# Patient Record
Sex: Female | Born: 1970 | Race: Black or African American | Hispanic: No | Marital: Married | Smoking: Never smoker
Health system: Southern US, Community
[De-identification: ages and names within clinical notes are randomized; demographics above are authoritative.]

## PROBLEM LIST (undated history)

## (undated) DIAGNOSIS — E079 Disorder of thyroid, unspecified: Secondary | ICD-10-CM

## (undated) DIAGNOSIS — K602 Anal fissure, unspecified: Secondary | ICD-10-CM

## (undated) DIAGNOSIS — I10 Essential (primary) hypertension: Secondary | ICD-10-CM

## (undated) DIAGNOSIS — K219 Gastro-esophageal reflux disease without esophagitis: Secondary | ICD-10-CM

## (undated) DIAGNOSIS — D649 Anemia, unspecified: Secondary | ICD-10-CM

## (undated) DIAGNOSIS — Z8709 Personal history of other diseases of the respiratory system: Secondary | ICD-10-CM

## (undated) DIAGNOSIS — E785 Hyperlipidemia, unspecified: Secondary | ICD-10-CM

## (undated) HISTORY — DX: Essential (primary) hypertension: I10

## (undated) HISTORY — DX: Anal fissure, unspecified: K60.2

## (undated) HISTORY — DX: Hyperlipidemia, unspecified: E78.5

## (undated) HISTORY — PX: APPENDECTOMY: SHX54

## (undated) HISTORY — DX: Personal history of other diseases of the respiratory system: Z87.09

## (undated) HISTORY — DX: Gastro-esophageal reflux disease without esophagitis: K21.9

## (undated) HISTORY — PX: BREAST SURGERY: SHX581

## (undated) HISTORY — PX: WISDOM TOOTH EXTRACTION: SHX21

## (undated) HISTORY — DX: Anemia, unspecified: D64.9

## (undated) HISTORY — DX: Disorder of thyroid, unspecified: E07.9

---

## 2000-10-01 ENCOUNTER — Emergency Department (HOSPITAL_COMMUNITY): Admission: EM | Admit: 2000-10-01 | Discharge: 2000-10-01 | Payer: Self-pay | Admitting: Emergency Medicine

## 2002-12-02 ENCOUNTER — Encounter: Admission: RE | Admit: 2002-12-02 | Discharge: 2002-12-02 | Payer: Self-pay | Admitting: *Deleted

## 2002-12-02 ENCOUNTER — Encounter: Payer: Self-pay | Admitting: Allergy and Immunology

## 2003-02-07 ENCOUNTER — Other Ambulatory Visit: Admission: RE | Admit: 2003-02-07 | Discharge: 2003-02-07 | Payer: Self-pay | Admitting: Obstetrics and Gynecology

## 2004-03-01 ENCOUNTER — Other Ambulatory Visit: Admission: RE | Admit: 2004-03-01 | Discharge: 2004-03-01 | Payer: Self-pay | Admitting: Obstetrics and Gynecology

## 2004-03-16 ENCOUNTER — Ambulatory Visit (HOSPITAL_COMMUNITY): Admission: RE | Admit: 2004-03-16 | Discharge: 2004-03-16 | Payer: Self-pay | Admitting: Obstetrics and Gynecology

## 2004-06-04 ENCOUNTER — Ambulatory Visit (HOSPITAL_COMMUNITY): Admission: RE | Admit: 2004-06-04 | Discharge: 2004-06-04 | Payer: Self-pay | Admitting: Obstetrics and Gynecology

## 2004-10-07 ENCOUNTER — Inpatient Hospital Stay (HOSPITAL_COMMUNITY): Admission: AD | Admit: 2004-10-07 | Discharge: 2004-10-07 | Payer: Self-pay | Admitting: Obstetrics and Gynecology

## 2004-10-08 ENCOUNTER — Ambulatory Visit: Payer: Self-pay | Admitting: Family Medicine

## 2004-10-11 ENCOUNTER — Ambulatory Visit (HOSPITAL_COMMUNITY): Admission: RE | Admit: 2004-10-11 | Discharge: 2004-10-11 | Payer: Self-pay | Admitting: Obstetrics and Gynecology

## 2004-10-11 ENCOUNTER — Ambulatory Visit: Payer: Self-pay | Admitting: *Deleted

## 2004-10-19 ENCOUNTER — Inpatient Hospital Stay (HOSPITAL_COMMUNITY): Admission: AD | Admit: 2004-10-19 | Discharge: 2004-10-24 | Payer: Self-pay | Admitting: Obstetrics and Gynecology

## 2005-07-18 ENCOUNTER — Encounter: Admission: RE | Admit: 2005-07-18 | Discharge: 2005-07-18 | Payer: Self-pay | Admitting: Internal Medicine

## 2005-07-25 ENCOUNTER — Other Ambulatory Visit: Admission: RE | Admit: 2005-07-25 | Discharge: 2005-07-25 | Payer: Self-pay | Admitting: Obstetrics and Gynecology

## 2009-06-23 ENCOUNTER — Emergency Department (HOSPITAL_BASED_OUTPATIENT_CLINIC_OR_DEPARTMENT_OTHER): Admission: EM | Admit: 2009-06-23 | Discharge: 2009-06-23 | Payer: Self-pay | Admitting: Emergency Medicine

## 2010-02-05 ENCOUNTER — Encounter: Admission: RE | Admit: 2010-02-05 | Discharge: 2010-02-05 | Payer: Self-pay | Admitting: Obstetrics and Gynecology

## 2010-11-16 NOTE — Op Note (Signed)
NAMEMIRIAM, Paula Huffman NO.:  [id number]   MEDICAL RECORD NO.:  [id number]          PATIENT TYPE:  INP   LOCATION:  9151                          FACILITY:  [facility]   PHYSICIAN:  Crist Fat. Rivard, M.D. DATE OF BIRTH:  Apr 12, 1971   DATE OF PROCEDURE:  DATE OF DISCHARGE:                                 OPERATIVE REPORT   PREOPERATIVE DIAGNOSES:  Intra-uterine pregnancy at 39 weeks and 3 days, pre-  eclampsia, failure to progress.   POSTOPERATIVE DIAGNOSES:  Intra-uterine pregnancy at 39 weeks and 3 days,  pre-eclampsia, failure to progress.   ANESTHESIA:  Epidural, Dr. Arby Barrette.   PROCEDURE:  Primary low transverse cesarean section.   SURGEON:  Dr. Estanislado Pandy.   ASSISTANT:  Concha Pyo. Duplantis, C.N.M.   ESTIMATED BLOOD LOSS:  800 cc.   PROCEDURE:  After being informed of the planned procedure with possible  complications including bleeding, infection, injury to bowels, bladder, or  ureters, informed consent is obtained.  The patient is taken to OR #1 and  pre-existing epidural is reinforced.  She is placed in the dorsal decubitus  position, pelvis tilted to the left, prepped and draped in a sterile fashion  and a Foley catheter was already in place.   After accessing adequate level of anesthesia, the suprapubic area is  infiltrated with Marcaine 0.25, 20 cc.  We performed a Pfannenstiel incision  which is brought down to the fascia.  The fascia is incised in the low  transverse fashion.  The linea alba is dissected.  The peritoneum is entered  in the midline fashion.  The visceroperitoneum is entered in the low  transverse fashion, allowing [place] to safely retract bladder by developing a  bladder flap.  Myometrium is entered in a low transverse fashion, first with  knife and then extended bluntly.  Amniotic fluid is clear.  We assist the  birth of a female infant in left OP.  Mouth and nose are suctioned.  Nuchal  cord is reduced.  Body is delivered.  Cord is clamped  with two Kelly clamps  in sections and the baby is given to the pediatrician present in the room.  Please note that the baby's head was completely outside the bony pelvis with  the ballotable presentation.  The placenta is allowed to deliver  spontaneously.  It is complete.  Cord has three vessels and the cord does  have a velamentous insertion.   Uterine revision is negative.   We proceed with closure of the myometrium in two layers, first with a  running locked suture of 0 Vicryl, then with a Lembert suture of 0 Vicryl,  imbricating the first layer.  Hemostasis is accessed and adequate.  Both  tubes and ovaries are assessed and normal.  Both pericolic gutters are  cleansed and the pelvis is profusely irrigated with warm saline.  Hemostasis  is rechecked and adequate.   On the fascia, hemostasis is completed with cautery and the fascia is closed  with two running sutures of 1 Vicryl meeting midline.  The wound is  irrigated with warm saline and hemostasis is achieved with cautery.  The  skin is closed with staples.   Instruments and sponge count is complete x2.  Estimated blood loss is 800  cc.  Urine output during the procedure is 212 cc.  IV input 900 cc.  The  little girl named Pablo Ledger was born at 4:58 p.m., received an Apgar of  9 at one minute and 9 at five minutes and weighed 7 pounds.  The procedure  was well tolerated by the patient who is taken to the recovery room in a  well and stable condition.      SAR/MEDQ  D:  10/20/2004  T:  10/21/2004  Job:  [id number]

## 2010-11-16 NOTE — H&P (Signed)
Paula Huffman, Paula Huffman NO.:  [id number]   MEDICAL RECORD NO.:  [id number]          PATIENT TYPE:  INP   LOCATION:  9174                          [facility]ACILITY:  [facility]   PHYSICIAN:  Crist Fat. Rivard, M.D. DATE OF BIRTH:  [birth year]   DATE OF ADMISSION:  10/19/2004  DATE OF DISCHARGE:                                HISTORY & PHYSICAL   HISTORY OF PRESENT ILLNESS:  This is a 40 year old gravida 1 para 0 at 69  and two-sevenths weeks who presents from the office with preeclampsia. Blood  pressures have been elevated since early April with onset of proteinuria  this week. She does have a history of chronic hypertension per chart review,  though the patient denies this history. She had a 24-hour urine protein on  October 05, 2004 which was 275 and this week it was greater than 300. She has  been on Aldomet and bedrest for several weeks. Pregnancy has been followed  by the nurse midwife service with M.D. collaboration and remarkable for:  1.  First trimester bleeding.  2.  Graves disease/hypothyroidism.  3.  AMA.  4.  PENICILLIN allergy.  5.  Chronic hypertension.  6.  Questionable borderline pelvic.   OBSTETRICAL HISTORY:  The patient is a primigravida.   ALLERGIES:  PENICILLIN causes a rash.   MEDICATIONS:  Prenatal vitamins and Aldomet.   MEDICAL HISTORY:  Remarkable for childhood varicella, chronic hypertension,  history of mild asthma, history of Graves disease for which she takes  Synthroid for hypothyroidism. She has a history of acid reflux, relieved by  Prevacid. The patient has a history of high cholesterol and had been on  Lipitor for that.   SURGICAL HISTORY:  Remarkable for an appendectomy in 1995 and a breast lump  in 1998.   FAMILY HISTORY:  Remarkable for a grandfather with heart disease. Mother and  sisters with hypertension. Grandparents and father with hypertension.  Grandmother with varicose veins and another grandmother with a blood clot.  Mother  with borderline diabetes and grandmother with diabetes. Mother with  hyperthyroidism and grandmother with hyperthyroidism. She has a grandmother  with Alzheimer's.   GENETIC HISTORY:  Remarkable for the patient's age of 57 and a paternal  grandmother with sickle cell trait.   SOCIAL HISTORY:  The patient is single. The father of the baby, Alva Garnet, is not currently with the patient. The patient works as a Publishing rights manager. She denies any alcohol, tobacco, or drug use.   PRENATAL LABORATORY DATA:  Hemoglobin 12.1, platelets 300. Blood type O  positive, antibody screen negative. Sickle cell negative. RPR nonreactive.  Rubella immune. Hepatitis negative. HIV negative. Cystic fibrosis declined.  Glucola was normal. Group B strep was negative. Gonorrhea and chlamydia were  negative at term.   HISTORY OF CURRENT PREGNANCY:  The patient entered care at [redacted] weeks  gestation. She had an ultrasound at that time to confirm dates. She had some  first trimester bleeding which resolved. Her Synthroid was increased to 75  mcg at 16 weeks. She had an ultrasound at 19 weeks which was normal. Her  thyroid  function was followed by her endocrinologist and they increased her  medication in the second trimester to meet her needs. She had a normal  Glucola at 27 weeks. She had one elevation in her blood pressure at 29 weeks  with no symptoms of preeclampsia at that time. She continued to have labile  blood pressures after that. She was seen by Dr. Pennie Rushing and Dr. Estanislado Pandy  periodically. Her 24-hour urine at 30 weeks was 100 mg per 24 hours and so  she began to work half days and did some bedrest. Blood pressure remained  120/80 to 130/80. She had a negative group B strep and cultures at 36 weeks.  A 24-hour urine was repeated on October 05, 2004 and showed 275 mg per 24  hours. Blood pressure ran 150/100 at that visit. She was placed on bedrest  and Aldomet for treatment of her blood pressures and monitored  closely. She  presented this week with increased blood pressure and increased proteinuria  and is therefore being admitted for induction of labor.   OBJECTIVE DATA:  VITAL SIGNS:  Stable, blood pressure was 130/100 in the  office.  HEENT:  Within normal limits. Thyroid normal, not enlarged.  CHEST:  Clear to auscultation.  HEART:  Regular rate and rhythm.  ABDOMEN:  Gravid at 39 cm, vertex to Leopold's. Fetal heart rate 150 in the  office. She has not been placed on fetal monitoring as of yet. There are no  uterine contractions other than [employer].  PELVIC:  Cervix is to be examined by Dr. Estanislado Pandy.  EXTREMITIES:  Show 1-2+ pedal edema with trace edema in the hands. DTRs are  2+.   ASSESSMENT:  1.  Intrauterine pregnancy at 46 and two-sevenths weeks.  2.  Preeclampsia.   PLAN:  1.  Admit to birthing suites were Dr. Estanislado Pandy.  2.  Induction of labor.  3.  Magnesium sulfate.  4.  M.D. to follow.      MLW/MEDQ  D:  10/19/2004  T:  10/19/2004  Job:  [id number]

## 2010-11-16 NOTE — Discharge Summary (Signed)
Paula Huffman, BARTOLOTTA NO.:  [id number]   MEDICAL RECORD NO.:  [id number]          PATIENT TYPE:  INP   LOCATION:  9113                          [facility]ILITY:  [facility]   PHYSICIAN:  Hal Morales, M.D.DATE OF BIRTH:  1971/02/26   DATE OF ADMISSION:  10/19/2004  DATE OF DISCHARGE:                                 DISCHARGE SUMMARY   ADMITTING DIAGNOSES:  1.  Intrauterine pregnancy at 3 and three-sevenths weeks.  2.  Preeclampsia.   PREOPERATIVE DIAGNOSES:  1.  Intrauterine pregnancy at 57 and three-sevenths weeks.  2.  Preeclampsia.  3.  Failure to progress.   PROCEDURE:  Primary low transverse cesarean section.   DISCHARGE DIAGNOSES:  1.  Intrauterine pregnancy at 30 and three-sevenths weeks.  2.  Preeclampsia.  3.  Failure to progress.  4.  Primary low transverse cesarean section.  5.  Low-grade maternal temperature postoperatively.   Ms. Paula Huffman is a 41 year old gravida 1 para 0 who presented from the office of  [facility] with elevated blood pressures and proteinuria for induction of labor  secondary to preeclampsia. She progressed to 4 cm dilated and despite  adequate contractions, did not progress further. She therefore consented to  cesarean delivery. This was accomplished on October 20, 2004 by Dr. Dois Davenport  Rivard. The patient underwent primary low transverse cesarean section with  the birth of a 7-pound female infant named Pablo Ledger with Apgar scores  of 9 at one minute, 9 at five minutes. The patient was maintained on  magnesium sulfate x24 hours after delivery. She diuresed well; however, she  developed a low-grade temperature to 100.5 on postoperative day #2 and her  blood pressures continued to be mildly elevated in the 130s to 140s over 80s  to 90s. She was begun on hydrochlorothiazide and arrangements were made for  postpartum visits of the [employer] nurse. Twenty-four hours after starting  hydrochlorothiazide her blood pressures have continued to be  maintained in  the 130s to 140s over 80s to 90s and are stable. Her temperature has been  afebrile for greater than 24 hours and she is in satisfactory condition for  discharge. Her incision is clean and intact with staples which will be  removed prior to discharge. Her extremities show 1+ edema, DTRs are 1+ with  no clonus. She is aware of PIH symptoms and will call for any of these  symptoms or any problems or concerns.   DISCHARGE INSTRUCTIONS:  Per [facility] handout.   DISCHARGE MEDICATIONS:  1.  Motrin 600 mg p.o. q.6h. p.r.n. pain.  2.  Tylox one to two p.o. q.3-4h. pain.  3.  Prenatal vitamins.  4.  She will continue hydrochlorothiazide 25 mg p.o. daily and be seen by      the [facility] nurse in 48 hours, and at [facility] in 10 days. She will plan      to stop hydrochlorothiazide at that time if her blood pressures have      remained normal.      SDM/MEDQ  D:  10/24/2004  T:  10/24/2004  Job:  [id number]

## 2011-02-11 ENCOUNTER — Other Ambulatory Visit (HOSPITAL_BASED_OUTPATIENT_CLINIC_OR_DEPARTMENT_OTHER): Payer: Self-pay | Admitting: Obstetrics and Gynecology

## 2011-02-11 DIAGNOSIS — Z1231 Encounter for screening mammogram for malignant neoplasm of breast: Secondary | ICD-10-CM

## 2011-02-21 ENCOUNTER — Ambulatory Visit (HOSPITAL_BASED_OUTPATIENT_CLINIC_OR_DEPARTMENT_OTHER)
Admission: RE | Admit: 2011-02-21 | Discharge: 2011-02-21 | Disposition: A | Discharge status: Home / Self Care | Payer: BC Managed Care – PPO | Source: Ambulatory Visit | Attending: Obstetrics and Gynecology | Admitting: Obstetrics and Gynecology

## 2011-02-21 DIAGNOSIS — Z1231 Encounter for screening mammogram for malignant neoplasm of breast: Secondary | ICD-10-CM | POA: Insufficient documentation

## 2012-02-25 ENCOUNTER — Other Ambulatory Visit: Payer: Self-pay | Admitting: Obstetrics and Gynecology

## 2012-02-25 DIAGNOSIS — Z139 Encounter for screening, unspecified: Secondary | ICD-10-CM

## 2012-02-26 ENCOUNTER — Ambulatory Visit (HOSPITAL_BASED_OUTPATIENT_CLINIC_OR_DEPARTMENT_OTHER): Payer: BC Managed Care – PPO

## 2012-02-26 ENCOUNTER — Ambulatory Visit (HOSPITAL_BASED_OUTPATIENT_CLINIC_OR_DEPARTMENT_OTHER)
Admission: RE | Admit: 2012-02-26 | Discharge: 2012-02-26 | Disposition: A | Discharge status: Home / Self Care | Payer: BC Managed Care – PPO | Source: Ambulatory Visit | Attending: Obstetrics and Gynecology | Admitting: Obstetrics and Gynecology

## 2012-02-26 DIAGNOSIS — Z139 Encounter for screening, unspecified: Secondary | ICD-10-CM

## 2012-02-26 DIAGNOSIS — R922 Inconclusive mammogram: Secondary | ICD-10-CM | POA: Insufficient documentation

## 2012-03-03 ENCOUNTER — Other Ambulatory Visit: Payer: Self-pay | Admitting: Obstetrics and Gynecology

## 2012-03-03 DIAGNOSIS — R928 Other abnormal and inconclusive findings on diagnostic imaging of breast: Secondary | ICD-10-CM

## 2012-03-04 ENCOUNTER — Ambulatory Visit
Admission: RE | Admit: 2012-03-04 | Discharge: 2012-03-04 | Disposition: A | Discharge status: Home / Self Care | Payer: BC Managed Care – PPO | Source: Ambulatory Visit | Attending: Obstetrics and Gynecology | Admitting: Obstetrics and Gynecology

## 2012-03-04 DIAGNOSIS — R928 Other abnormal and inconclusive findings on diagnostic imaging of breast: Secondary | ICD-10-CM

## 2012-04-08 ENCOUNTER — Other Ambulatory Visit: Payer: Self-pay

## 2012-04-28 ENCOUNTER — Encounter: Payer: Self-pay | Admitting: Obstetrics and Gynecology

## 2012-04-28 ENCOUNTER — Ambulatory Visit (INDEPENDENT_AMBULATORY_CARE_PROVIDER_SITE_OTHER): Payer: BC Managed Care – PPO | Admitting: Obstetrics and Gynecology

## 2012-04-28 VITALS — BP 132/80 | Ht 63.5 in | Wt 183.0 lb

## 2012-04-28 DIAGNOSIS — Z124 Encounter for screening for malignant neoplasm of cervix: Secondary | ICD-10-CM

## 2012-04-28 DIAGNOSIS — Z01419 Encounter for gynecological examination (general) (routine) without abnormal findings: Secondary | ICD-10-CM

## 2012-04-28 MED ORDER — NORETHINDRONE 0.35 MG PO TABS: 1.0000 | ORAL_TABLET | Freq: Every day | ORAL | Status: AC

## 2012-04-28 NOTE — Progress Notes (Signed)
Regular Periods: yes Mammogram: yes  Monthly Breast Ex.: yes Exercise: no  Tetanus < 10 years: yes Seatbelts: yes  NI. Bladder Functn.: yes Abuse at home: no  Daily BM's: yes Stressful Work: yes  Healthy Diet: yes Sigmoid-Colonoscopy: NO  Calcium: no Medical problems this year: NONE   LAST PAP:2012  Contraception: CAMILA  Mammogram:  8/13  PCP: DR. MCNEILL  PMH: NO CHANGE  FMH: NO CHANGE  Last Bone Scan: NO  PT IS MARRIED

## 2012-04-28 NOTE — Progress Notes (Signed)
Subjective:    Paula Huffman is a 41 y.o. female, G1P0, who presents for an annual exam. The patient reports no complaints.  Menstrual cycle:   LMP: Patient's last menstrual period was 04/14/2012.             Review of Systems Pertinent items are noted in HPI. Denies pelvic pain, urinary tract symptoms, vaginitis symptoms, irregular bleeding, menopausal symptoms, change in bowel habits or rectal bleeding   Objective:    BP 132/80  Ht 5' 3.5" (1.613 m)  Wt 183 lb (83.008 kg)  BMI 31.91 kg/m2  LMP 04/14/2012     Wt Readings from Last 1 Encounters:  04/28/12 183 lb (83.008 kg)   Body mass index is 31.91 kg/(m^2). General Appearance: Alert, no acute distress HEENT: Grossly normal Neck / Thyroid: Supple, no thyromegaly or cervical adenopathy Lungs: Clear to auscultation bilaterally Back: No CVA tenderness Breast Exam: No masses or nodes.No dimpling, nipple retraction or discharge. Cardiovascular: Regular rate and rhythm.  Gastrointestinal: Soft, non-tender, no masses or organomegaly Pelvic Exam: EGBUS-wnl, vagina-normal rugae, cervix- without lesions or tenderness, uterus appears normal size shape and consistency, adnexae-no masses or tenderness Rectovaginal: no masses and normal sphincter tone Lymphatic Exam: Non-palpable nodes in neck, clavicular,  axillary, or inguinal regions  Skin: no rashes or abnormalities Extremities: no clubbing cyanosis or edema  Neurologic: grossly normal Psychiatric: Alert and oriented    Assessment:   Routine GYN Exam   Plan:    PAP sent  Continue Camila  #1  1 po qd 11 refills  RTO 1 year or prn  Adeliz Tonkinson,ELMIRAPA-C

## 2012-04-29 LAB — PAP IG W/ RFLX HPV ASCU

## 2013-02-10 ENCOUNTER — Other Ambulatory Visit: Payer: Self-pay | Admitting: Obstetrics and Gynecology

## 2013-02-10 DIAGNOSIS — Z1231 Encounter for screening mammogram for malignant neoplasm of breast: Secondary | ICD-10-CM

## 2013-03-05 ENCOUNTER — Ambulatory Visit (HOSPITAL_BASED_OUTPATIENT_CLINIC_OR_DEPARTMENT_OTHER)
Admission: RE | Admit: 2013-03-05 | Discharge: 2013-03-05 | Disposition: A | Discharge status: Home / Self Care | Payer: BC Managed Care – PPO | Source: Ambulatory Visit | Attending: Obstetrics and Gynecology | Admitting: Obstetrics and Gynecology

## 2013-03-05 DIAGNOSIS — Z1231 Encounter for screening mammogram for malignant neoplasm of breast: Secondary | ICD-10-CM | POA: Insufficient documentation

## 2014-02-11 ENCOUNTER — Other Ambulatory Visit (HOSPITAL_BASED_OUTPATIENT_CLINIC_OR_DEPARTMENT_OTHER): Payer: Self-pay | Admitting: Obstetrics and Gynecology

## 2014-02-11 DIAGNOSIS — Z1231 Encounter for screening mammogram for malignant neoplasm of breast: Secondary | ICD-10-CM

## 2014-03-11 ENCOUNTER — Ambulatory Visit (HOSPITAL_BASED_OUTPATIENT_CLINIC_OR_DEPARTMENT_OTHER): Payer: BC Managed Care – PPO

## 2014-03-18 ENCOUNTER — Ambulatory Visit (HOSPITAL_BASED_OUTPATIENT_CLINIC_OR_DEPARTMENT_OTHER)
Admission: RE | Admit: 2014-03-18 | Discharge: 2014-03-18 | Disposition: A | Discharge status: Home / Self Care | Payer: BC Managed Care – PPO | Source: Ambulatory Visit | Attending: Obstetrics and Gynecology | Admitting: Obstetrics and Gynecology

## 2014-03-18 DIAGNOSIS — Z1231 Encounter for screening mammogram for malignant neoplasm of breast: Secondary | ICD-10-CM | POA: Diagnosis present

## 2014-05-02 ENCOUNTER — Encounter: Payer: Self-pay | Admitting: Obstetrics and Gynecology

## 2014-11-03 ENCOUNTER — Other Ambulatory Visit: Payer: Self-pay | Admitting: Physician Assistant

## 2014-11-03 ENCOUNTER — Ambulatory Visit
Admission: RE | Admit: 2014-11-03 | Discharge: 2014-11-03 | Disposition: A | Discharge status: Home / Self Care | Payer: BC Managed Care – PPO | Source: Ambulatory Visit | Attending: Physician Assistant | Admitting: Physician Assistant

## 2014-11-03 DIAGNOSIS — R0781 Pleurodynia: Secondary | ICD-10-CM

## 2017-11-06 ENCOUNTER — Encounter: Payer: Self-pay | Admitting: Skilled Nursing Facility1

## 2017-11-06 ENCOUNTER — Encounter: Payer: BC Managed Care – PPO | Attending: Family Medicine | Admitting: Skilled Nursing Facility1

## 2017-11-06 DIAGNOSIS — E119 Type 2 diabetes mellitus without complications: Secondary | ICD-10-CM | POA: Diagnosis present

## 2017-11-06 DIAGNOSIS — Z713 Dietary counseling and surveillance: Secondary | ICD-10-CM | POA: Insufficient documentation

## 2017-11-06 NOTE — Progress Notes (Signed)
Diabetes Self-Management Education  Visit Type: First/Initial  11/06/2017  Paula Huffman, identified by name and date of birth, is a 47 y.o. female with a diagnosis of Diabetes: Type 2.   ASSESSMENT  There were no vitals taken for this visit. There is no height or weight on file to calculate BMI.  Pt was very knowledgeable and receptive.  Diabetes Self-Management Education - 11/06/17 1532      Visit Information   Visit Type  First/Initial      Initial Visit   Diabetes Type  Type 2    Are you currently following a meal plan?  No    Are you taking your medications as prescribed?  Yes      Psychosocial Assessment   Patient Belief/Attitude about Diabetes  Motivated to manage diabetes      Pre-Education Assessment   Patient understands the diabetes disease and treatment process.  Needs Instruction    Patient understands incorporating nutritional management into lifestyle.  Needs Instruction    Patient undertands incorporating physical activity into lifestyle.  Needs Instruction    Patient understands using medications safely.  Needs Instruction    Patient understands monitoring blood glucose, interpreting and using results  Needs Instruction    Patient understands prevention, detection, and treatment of acute complications.  Needs Instruction    Patient understands prevention, detection, and treatment of chronic complications.  Needs Instruction    Patient understands how to develop strategies to address psychosocial issues.  Needs Instruction    Patient understands how to develop strategies to promote health/change behavior.  Needs Instruction      Complications   Last HgB A1C per patient/outside source  6.7 %    How often do you check your blood sugar?  0 times/day (not testing)    Have you had a dilated eye exam in the past 12 months?  Yes    Have you had a dental exam in the past 12 months?  Yes    Are you checking your feet?  No      Dietary Intake   Breakfast   [place] bread oatmeal    Snack (morning)  granola bar    Lunch  sandwich or sub or salad     Snack (afternoon)  nuts or fruit or crackers    Dinner  chicken grilled or breaded with vegetable and starch or pizza    Beverage(s)  water, juice with water, unsweet herbal tea      Exercise   Exercise Type  ADL's      Patient Education   Previous Diabetes Education  No    Disease state   Definition of diabetes, type 1 and 2, and the diagnosis of diabetes;Factors that contribute to the development of diabetes    Nutrition management   Role of diet in the treatment of diabetes and the relationship between the three main macronutrients and blood glucose level;Food label reading, portion sizes and measuring food.;Meal options for control of blood glucose level and chronic complications.;Information on hints to eating out and maintain blood glucose control.    Physical activity and exercise   Role of exercise on diabetes management, blood pressure control and cardiac health.;Identified with patient nutritional and/or medication changes necessary with exercise.    Monitoring  Purpose and frequency of SMBG.;Yearly dilated eye exam;Daily foot exams    Acute complications  Taught treatment of hypoglycemia - the 15 rule.;Discussed and identified patients' treatment of hyperglycemia.    Chronic complications  Dental care;Retinopathy  and reason for yearly dilated eye exams;Assessed and discussed foot care and prevention of foot problems    Psychosocial adjustment  Role of stress on diabetes      Individualized Goals (developed by patient)   Nutrition  General guidelines for healthy choices and portions discussed;Adjust meds/carbs with exercise as discussed    Physical Activity  Exercise 5-7 days per week;30 minutes per day    Medications  take my medication as prescribed    Monitoring   test blood glucose pre and post meals as discussed      Post-Education Assessment   Patient understands the diabetes disease  and treatment process.  Demonstrates understanding / competency    Patient understands incorporating nutritional management into lifestyle.  Demonstrates understanding / competency    Patient undertands incorporating physical activity into lifestyle.  Demonstrates understanding / competency    Patient understands using medications safely.  Demonstrates understanding / competency    Patient understands monitoring blood glucose, interpreting and using results  Demonstrates understanding / competency    Patient understands prevention, detection, and treatment of acute complications.  Demonstrates understanding / competency    Patient understands prevention, detection, and treatment of chronic complications.  Demonstrates understanding / competency    Patient understands how to develop strategies to address psychosocial issues.  Demonstrates understanding / competency    Patient understands how to develop strategies to promote health/change behavior.  Demonstrates understanding / competency      Outcomes   Expected Outcomes  Demonstrated interest in learning. Expect positive outcomes    Future DMSE  PRN    Program Status  Completed       Individualized Plan for Diabetes Self-Management Training:   Learning Objective:  Patient will have a greater understanding of diabetes self-management. Patient education plan is to attend individual and/or group sessions per assessed needs and concerns.   Plan:   There are no Patient Instructions on file for this visit.  Expected Outcomes:  Demonstrated interest in learning. Expect positive outcomes  Education material provided: Living Well with Diabetes, Meal plan card, My Plate, Snack sheet and Support group flyer  If problems or questions, patient to contact team via:  Phone  Future DSME appointment: PRN

## 2020-01-19 ENCOUNTER — Other Ambulatory Visit: Payer: Self-pay | Admitting: Obstetrics and Gynecology

## 2020-01-19 DIAGNOSIS — N6452 Nipple discharge: Secondary | ICD-10-CM

## 2020-02-20 ENCOUNTER — Other Ambulatory Visit: Payer: Self-pay

## 2020-02-20 ENCOUNTER — Ambulatory Visit
Admission: RE | Admit: 2020-02-20 | Discharge: 2020-02-20 | Disposition: A | Discharge status: Home / Self Care | Payer: BC Managed Care – PPO | Source: Ambulatory Visit | Attending: Obstetrics and Gynecology | Admitting: Obstetrics and Gynecology

## 2020-02-20 DIAGNOSIS — N6452 Nipple discharge: Secondary | ICD-10-CM

## 2020-02-20 MED ORDER — GADOBUTROL 1 MMOL/ML IV SOLN
9.0000 mL | Freq: Once | INTRAVENOUS | Status: AC | PRN
  Administered 2020-02-20: 9 mL via INTRAVENOUS

## 2020-02-25 ENCOUNTER — Other Ambulatory Visit: Payer: Self-pay | Admitting: Obstetrics and Gynecology

## 2020-02-25 DIAGNOSIS — N63 Unspecified lump in unspecified breast: Secondary | ICD-10-CM

## 2020-02-25 DIAGNOSIS — N6452 Nipple discharge: Secondary | ICD-10-CM

## 2020-03-01 ENCOUNTER — Ambulatory Visit
Admission: RE | Admit: 2020-03-01 | Discharge: 2020-03-01 | Disposition: A | Discharge status: Home / Self Care | Payer: BC Managed Care – PPO | Source: Ambulatory Visit | Attending: Obstetrics and Gynecology | Admitting: Obstetrics and Gynecology

## 2020-03-01 ENCOUNTER — Other Ambulatory Visit: Payer: Self-pay | Admitting: Obstetrics and Gynecology

## 2020-03-01 ENCOUNTER — Other Ambulatory Visit: Payer: Self-pay

## 2020-03-01 DIAGNOSIS — N6452 Nipple discharge: Secondary | ICD-10-CM

## 2020-03-01 DIAGNOSIS — N63 Unspecified lump in unspecified breast: Secondary | ICD-10-CM

## 2020-03-13 ENCOUNTER — Ambulatory Visit
Admission: RE | Admit: 2020-03-13 | Discharge: 2020-03-13 | Disposition: A | Discharge status: Home / Self Care | Payer: BC Managed Care – PPO | Source: Ambulatory Visit | Attending: Obstetrics and Gynecology | Admitting: Obstetrics and Gynecology

## 2020-03-13 ENCOUNTER — Other Ambulatory Visit: Payer: Self-pay | Admitting: Obstetrics and Gynecology

## 2020-03-13 ENCOUNTER — Other Ambulatory Visit: Payer: Self-pay

## 2020-03-13 DIAGNOSIS — N63 Unspecified lump in unspecified breast: Secondary | ICD-10-CM

## 2020-03-13 DIAGNOSIS — N644 Mastodynia: Secondary | ICD-10-CM

## 2021-01-11 DIAGNOSIS — E119 Type 2 diabetes mellitus without complications: Secondary | ICD-10-CM | POA: Insufficient documentation

## 2021-03-17 IMAGING — US US BREAST*L* LIMITED INC AXILLA
1 series · 8 of 8 positions shown · non-contrast
Comparison: Previous exam(s).

CLINICAL DATA: Patient underwent a left breast stereotactic core
needle biopsy, which yielded benign concordant results, on
03/01/2020. She had intermittent sharp type pain for a few days
after the biopsy, but more recently has developed more constant achy
type pain along the inferior to inferolateral aspect of the breast.
Over the last 1-2 days, this has improved.

EXAM:
ULTRASOUND OF THE LEFT BREAST

[Series 1: us breast*left* limited inc axilla · 0.07mm/px · 8 of 8 slices shown]
[im 1/8]
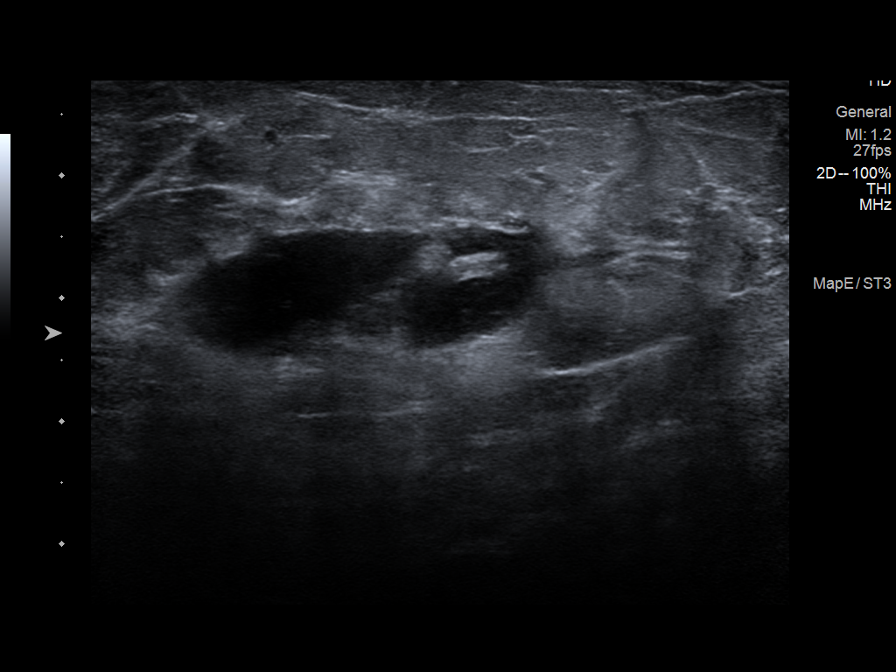
[im 2/8]
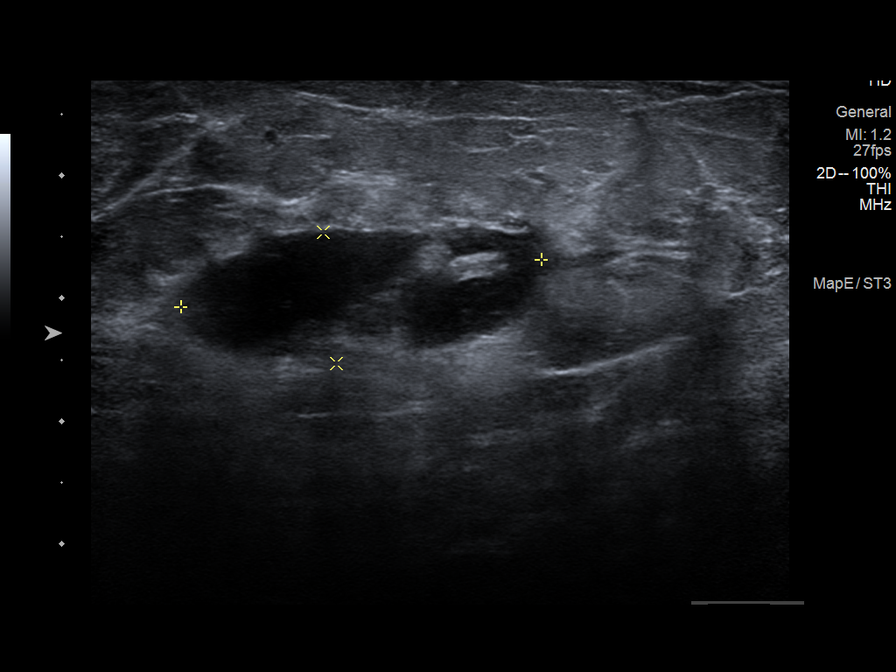
[im 3/8]
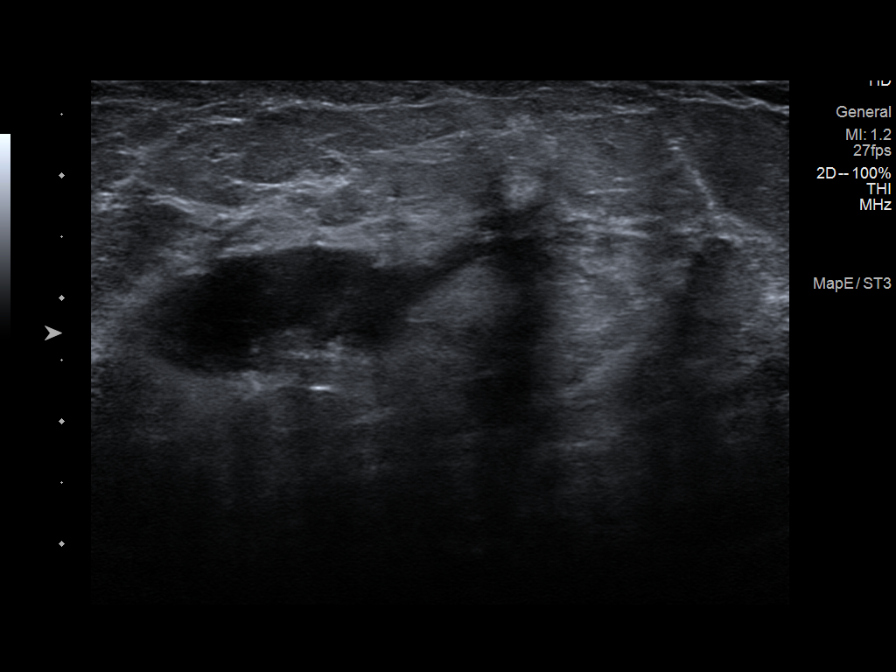
[im 4/8]
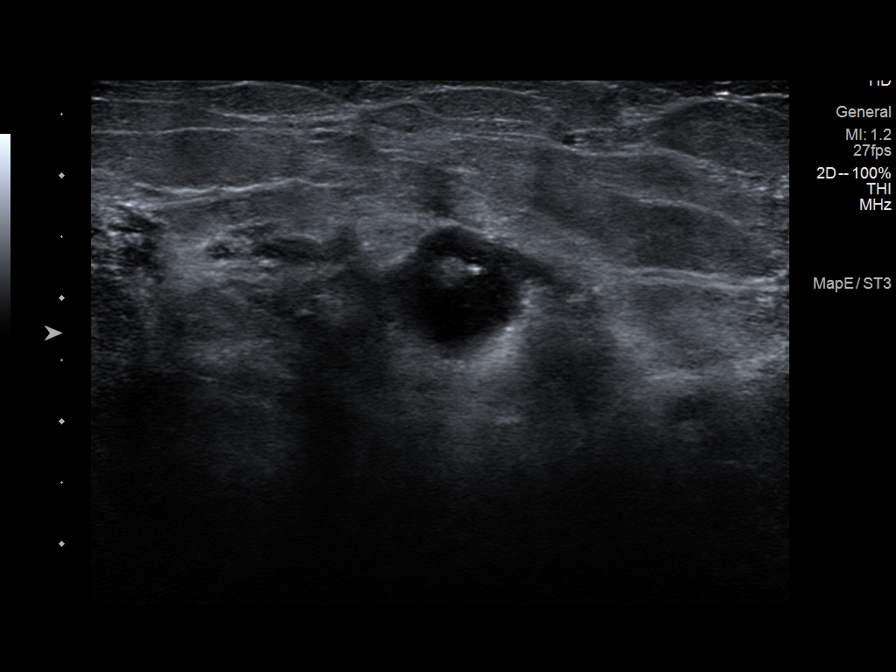
[im 5/8]
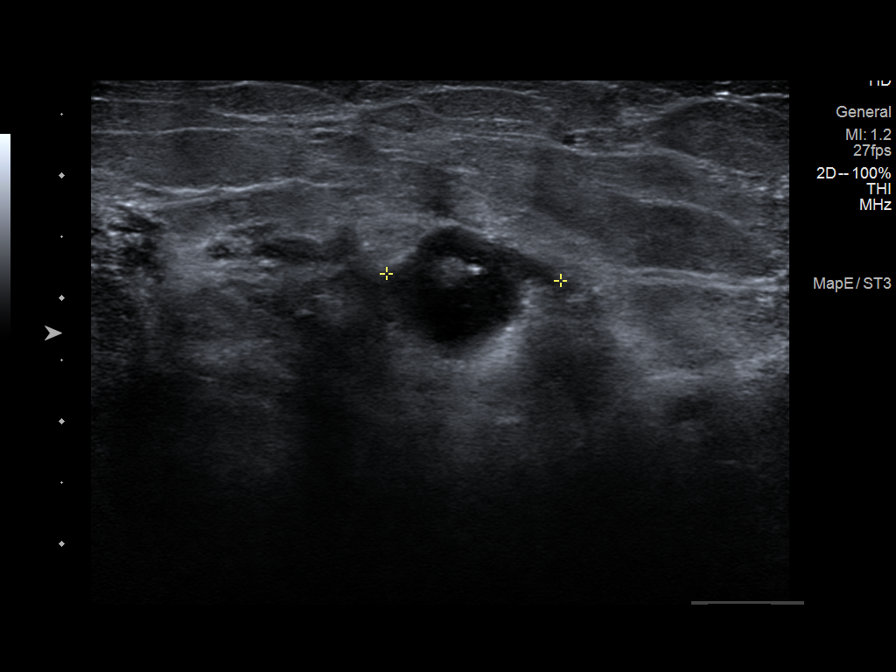
[im 6/8]
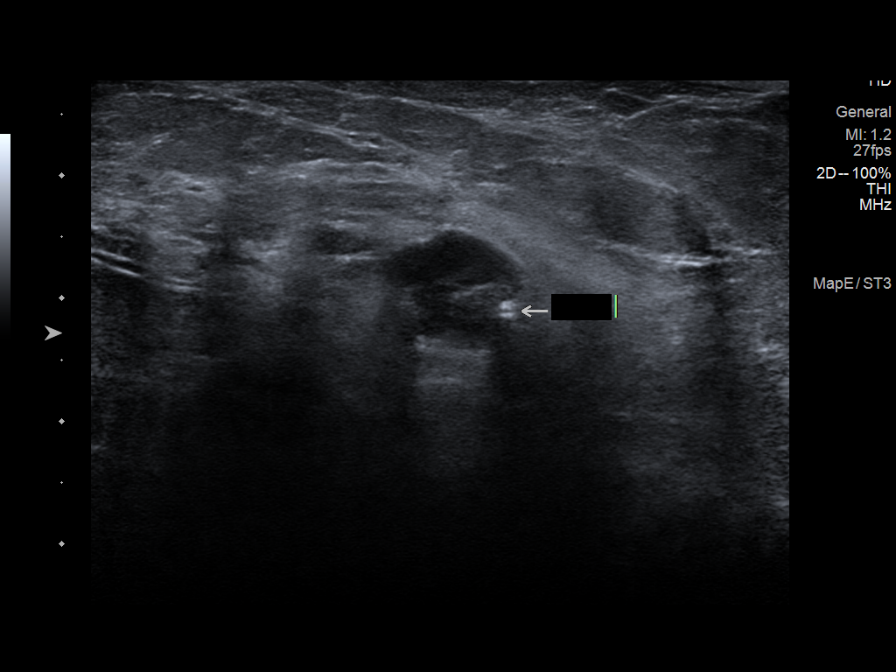
[im 7/8]
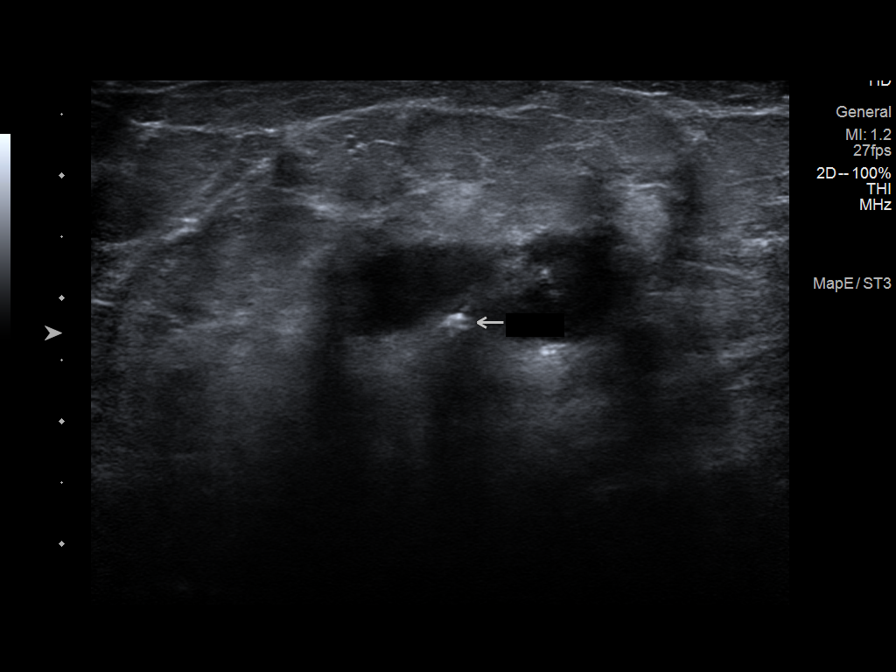
[im 8/8]
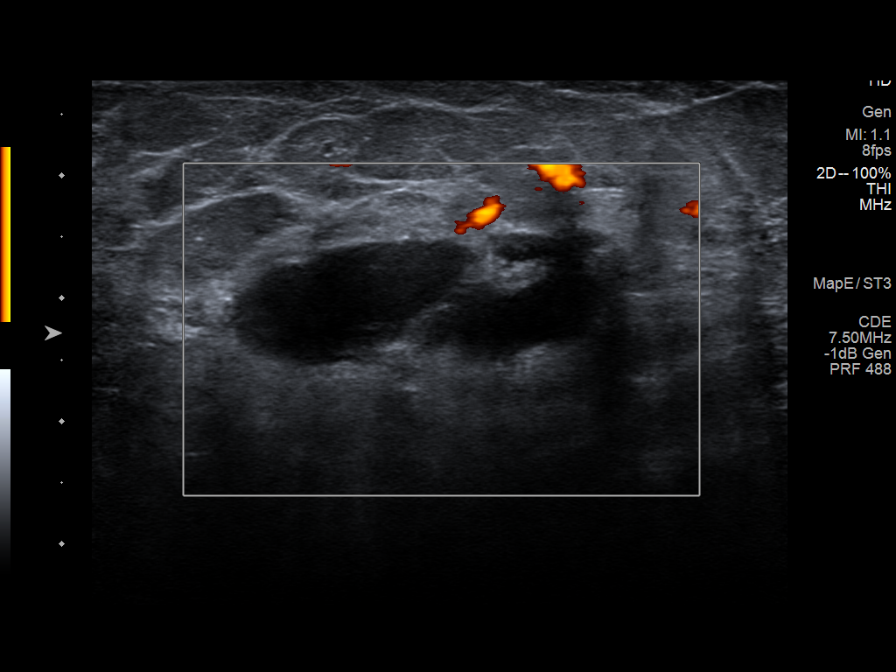

[8 of 8 positions shown; findings below may reference images not displayed]

FINDINGS: On physical exam, there is firmness and some tenderness along the
left breast between 3 and 4 o'clock, consistent with the site of the
biopsied lesion. At the site where the stereotactic needle was
introduced, there is a resolving bruise, but no erythema to suggest
infection. There is no increased skin warmth.

Targeted ultrasound is performed, showing a fluid collection in the
left breast at 3:30 o'clock, 4 cm the nipple, measuring 3.0 x 1.1 x
1.4 cm. The biopsy marker clip lies along 1 margin of the
collection. The post procedure hematoma along the marker clip
mammograms on 03/01/2020 revealed a hematoma measuring approximately
1.4 cm in long axis.
IMPRESSION: 1. Post procedure hematoma/seroma in the left breast at 3:30
o'clock, 4 cm the nipple, larger than the hematoma noted on the post
stereotactic core needle biopsy marker clip mammograms. There are no
clinical findings, however, to indicate infection.

RECOMMENDATION:
1. Patient should return to her normal annual screening mammography
schedule.
2. Patient was told that her pain should continue to improve as the
post procedure hematoma resolves. She should expect her pain to be
notably improved within a week. If, however, her pain increases,
particularly if associated with skin erythema and increased skin
warmth, she should return for repeat evaluation and possible
cyst/abscess aspiration.

I have discussed the findings and recommendations with the patient.
If applicable, a reminder letter will be sent to the patient
regarding the next appointment.

BI-RADS CATEGORY  2: Benign.

## 2021-04-30 ENCOUNTER — Other Ambulatory Visit: Payer: Self-pay | Admitting: Obstetrics and Gynecology

## 2021-04-30 DIAGNOSIS — R928 Other abnormal and inconclusive findings on diagnostic imaging of breast: Secondary | ICD-10-CM

## 2021-05-01 ENCOUNTER — Other Ambulatory Visit: Payer: Self-pay | Admitting: Obstetrics and Gynecology

## 2021-05-01 DIAGNOSIS — R928 Other abnormal and inconclusive findings on diagnostic imaging of breast: Secondary | ICD-10-CM

## 2021-05-19 ENCOUNTER — Other Ambulatory Visit: Payer: Self-pay

## 2021-05-19 ENCOUNTER — Other Ambulatory Visit: Payer: Self-pay | Admitting: Obstetrics and Gynecology

## 2021-05-19 ENCOUNTER — Ambulatory Visit
Admission: RE | Admit: 2021-05-19 | Discharge: 2021-05-19 | Disposition: A | Discharge status: Home / Self Care | Payer: BC Managed Care – PPO | Source: Ambulatory Visit | Attending: Obstetrics and Gynecology | Admitting: Obstetrics and Gynecology

## 2021-05-19 DIAGNOSIS — R928 Other abnormal and inconclusive findings on diagnostic imaging of breast: Secondary | ICD-10-CM

## 2021-05-23 ENCOUNTER — Other Ambulatory Visit: Payer: BC Managed Care – PPO

## 2021-11-27 ENCOUNTER — Ambulatory Visit
Admission: RE | Admit: 2021-11-27 | Discharge: 2021-11-27 | Disposition: A | Discharge status: Home / Self Care | Payer: BC Managed Care – PPO | Source: Ambulatory Visit | Attending: Obstetrics and Gynecology | Admitting: Obstetrics and Gynecology

## 2021-11-27 DIAGNOSIS — R928 Other abnormal and inconclusive findings on diagnostic imaging of breast: Secondary | ICD-10-CM

## 2021-11-30 DIAGNOSIS — N6489 Other specified disorders of breast: Secondary | ICD-10-CM | POA: Insufficient documentation

## 2021-12-03 ENCOUNTER — Ambulatory Visit: Payer: BC Managed Care – PPO | Attending: Internal Medicine

## 2021-12-03 DIAGNOSIS — Z23 Encounter for immunization: Secondary | ICD-10-CM

## 2021-12-03 NOTE — Progress Notes (Signed)
   Covid-19 Vaccination Clinic  Name:  Jaela Yepez    MRN: [id number] DOB: Nov 23, 1970  12/03/2021  Ms. Camm Blue was observed post Covid-19 immunization for 15 minutes without incident. She was provided with Vaccine Information Sheet and instruction to access the V-Safe system.   Ms. Ansleigh Safer was instructed to call 911 with any severe reactions post vaccine: Difficulty breathing  Swelling of face and throat  A fast heartbeat  A bad rash all over body  Dizziness and weakness   Immunizations Administered     Name Date Dose VIS Date Route   Pfizer Covid-19 Vaccine Bivalent Booster 12/03/2021 11:30 AM 0.3 mL 02/28/2021 Intramuscular   Manufacturer: [employer], [employer]   Lot: [id number]   NDC: [phone]

## 2021-12-07 ENCOUNTER — Other Ambulatory Visit (HOSPITAL_BASED_OUTPATIENT_CLINIC_OR_DEPARTMENT_OTHER): Payer: Self-pay

## 2021-12-07 MED ORDER — PFIZER COVID-19 VAC BIVALENT 30 MCG/0.3ML IM SUSP
INTRAMUSCULAR | 0 refills | Status: DC
  Filled 2021-12-07: qty 0.3, 1d supply, fill #0

## 2022-05-15 DIAGNOSIS — E039 Hypothyroidism, unspecified: Secondary | ICD-10-CM | POA: Insufficient documentation

## 2022-05-15 DIAGNOSIS — E079 Disorder of thyroid, unspecified: Secondary | ICD-10-CM | POA: Insufficient documentation

## 2022-05-15 DIAGNOSIS — K219 Gastro-esophageal reflux disease without esophagitis: Secondary | ICD-10-CM | POA: Insufficient documentation

## 2022-05-15 DIAGNOSIS — E785 Hyperlipidemia, unspecified: Secondary | ICD-10-CM | POA: Insufficient documentation

## 2022-05-15 DIAGNOSIS — I10 Essential (primary) hypertension: Secondary | ICD-10-CM | POA: Insufficient documentation

## 2022-07-01 ENCOUNTER — Ambulatory Visit
Admission: EM | Admit: 2022-07-01 | Discharge: 2022-07-01 | Disposition: A | Discharge status: Home / Self Care | Payer: BC Managed Care – PPO | Attending: Urgent Care | Admitting: Urgent Care

## 2022-07-01 DIAGNOSIS — J329 Chronic sinusitis, unspecified: Secondary | ICD-10-CM

## 2022-07-01 DIAGNOSIS — J4520 Mild intermittent asthma, uncomplicated: Secondary | ICD-10-CM

## 2022-07-01 DIAGNOSIS — I10 Essential (primary) hypertension: Secondary | ICD-10-CM

## 2022-07-01 DIAGNOSIS — J40 Bronchitis, not specified as acute or chronic: Secondary | ICD-10-CM

## 2022-07-01 MED ORDER — PROMETHAZINE-DM 6.25-15 MG/5ML PO SYRP: 5.0000 mL | ORAL_SOLUTION | Freq: Three times a day (TID) | ORAL | 0 refills | Status: DC | PRN

## 2022-07-01 MED ORDER — CETIRIZINE HCL 10 MG PO TABS: 10.0000 mg | ORAL_TABLET | Freq: Every day | ORAL | 0 refills | Status: DC

## 2022-07-01 MED ORDER — CIPROFLOXACIN HCL 500 MG PO TABS: 500.0000 mg | ORAL_TABLET | Freq: Two times a day (BID) | ORAL | 0 refills | Status: DC

## 2022-07-01 MED ORDER — HYDROCHLOROTHIAZIDE 12.5 MG PO TABS: 12.5000 mg | ORAL_TABLET | Freq: Every day | ORAL | 0 refills | Status: DC

## 2022-07-01 MED ORDER — ALBUTEROL SULFATE HFA 108 (90 BASE) MCG/ACT IN AERS: 1.0000 | INHALATION_SPRAY | Freq: Four times a day (QID) | RESPIRATORY_TRACT | 0 refills | Status: AC | PRN

## 2022-07-01 MED ORDER — PREDNISONE 20 MG PO TABS: ORAL_TABLET | ORAL | 0 refills | Status: DC

## 2022-07-01 NOTE — ED Triage Notes (Addendum)
Pt states cough and congestion for over a week. Took Mucinex today prior to arrival.

## 2022-07-01 NOTE — ED Provider Notes (Signed)
[facility]  Note:  This document was prepared using Systems analyst and may include unintentional dictation errors.  MRN: [id number] DOB: [birth year]  Subjective:   Paula Huffman is a 52 y.o. female presenting for 8-9 day history of acute onset persistent sinus congestion, sinus drainage, coughing, chest tightness.  Has a history of reactive airway disease and would like to have refill of her albuterol inhaler.  No overt fever, body pains, chest pain, wheezing.  Patient prefers to avoid the beta-lactam class and doxycycline.  No current facility-administered medications for this encounter.  Current Outpatient Medications:    amLODipine (NORVASC) 5 MG tablet, Take 5 mg by mouth daily., Disp: , Rfl:    COVID-19 mRNA bivalent vaccine, Pfizer, (PFIZER COVID-19 VAC BIVALENT) injection, Inject into the muscle., Disp: 0.3 mL, Rfl: 0   levothyroxine (SYNTHROID, LEVOTHROID) 112 MCG tablet, Take 112 mcg by mouth daily., Disp: , Rfl:    norethindrone (MICRONOR,CAMILA,ERRIN) 0.35 MG tablet, Take 1 tablet (0.35 mg total) by mouth daily., Disp: 1 Package, Rfl: 11   triamterene-hydrochlorothiazide (MAXZIDE-25) 37.5-25 MG per tablet, Take 1 tablet by mouth daily., Disp: , Rfl:    Allergies  Allergen Reactions   Penicillins    Vicodin [Hydrocodone-Acetaminophen]     Past Medical History:  Diagnosis Date   Anal fissure    Anemia    GERD (gastroesophageal reflux disease)    History of reactive airway disease    Hyperlipidemia    Hypertension    Thyroid disease    HYPOTHYROID     Past Surgical History:  Procedure Laterality Date   APPENDECTOMY     AGE 58   BREAST SURGERY     LEFT BREAST EXCISION;CALCIFICATION REMOVED   CESAREAN SECTION     WISDOM TOOTH EXTRACTION      Family History  Problem Relation Age of Onset   Heart disease Father    Heart attack Father    Hypertension Father    Asthma Daughter    Heart attack Other    Thyroid  disease Mother    Hypertension Mother    Diabetes Mother    Thyroid disease Maternal Aunt    Hypertension Paternal Aunt    Thyroid disease Maternal Grandmother    Hypertension Maternal Grandmother    Stroke Maternal Grandmother    Hypertension Maternal Grandfather    Hypertension Paternal Grandfather     Social History   Tobacco Use   Smoking status: Never   Smokeless tobacco: Never  Substance Use Topics   Alcohol use: Yes   Drug use: No    ROS   Objective:   Vitals: BP (!) 177/96 (BP Location: Right Arm)   Pulse 76   Temp 98.8 F (37.1 C) (Oral)   Resp 16   LMP 06/21/2022 (Approximate)   SpO2 95%   BP Readings from Last 3 Encounters:  07/01/22 (!) 177/96  04/28/12 132/80   Physical Exam Constitutional:      General: She is not in acute distress.    Appearance: Normal appearance. She is well-developed. She is not ill-appearing, toxic-appearing or diaphoretic.  HENT:     Head: Normocephalic and atraumatic.     Nose: Congestion present. No rhinorrhea.     Mouth/Throat:     Mouth: Mucous membranes are moist.     Pharynx: No oropharyngeal exudate or posterior oropharyngeal erythema.     Comments: Thick postnasal drainage overlying pharynx. Eyes:     General: No scleral icterus.  Right eye: No discharge.        Left eye: No discharge.     Extraocular Movements: Extraocular movements intact.  Cardiovascular:     Rate and Rhythm: Normal rate and regular rhythm.     Heart sounds: Normal heart sounds. No murmur heard.    No friction rub. No gallop.  Pulmonary:     Effort: Pulmonary effort is normal. No respiratory distress.     Breath sounds: No stridor. No wheezing, rhonchi or rales.  Chest:     Chest wall: No tenderness.  Skin:    General: Skin is warm and dry.  Neurological:     General: No focal deficit present.     Mental Status: She is alert and oriented to person, place, and time.  Psychiatric:        Mood and Affect: Mood normal.         Behavior: Behavior normal.     Assessment and Plan :   PDMP not reviewed this encounter.  1. Sinobronchitis   2. Essential hypertension   3. Mild intermittent reactive airway disease without complication     Will treat her sinobronchitis with ciprofloxacin given her medication allergies and also prednisone.  She forgot to take her blood pressure medicine this morning.  However, as we are using an oral prednisone course to address the bronchitis side of her infection recommended taking hydrochlorothiazide and emphasized need to maintain her other blood pressure medications.  Refilled her albuterol inhaler.  Deferred imaging given clear cardiopulmonary exam, hemodynamically stable vital signs. Counseled patient on potential for adverse effects with medications prescribed/recommended today, ER and return-to-clinic precautions discussed, patient verbalized understanding.    Jaynee Eagles, [place] 07/01/22 1123

## 2022-09-12 ENCOUNTER — Ambulatory Visit
Admission: EM | Admit: 2022-09-12 | Discharge: 2022-09-12 | Disposition: A | Discharge status: Home / Self Care | Payer: BC Managed Care – PPO | Attending: Nurse Practitioner | Admitting: Nurse Practitioner

## 2022-09-12 DIAGNOSIS — J40 Bronchitis, not specified as acute or chronic: Secondary | ICD-10-CM | POA: Diagnosis not present

## 2022-09-12 DIAGNOSIS — J329 Chronic sinusitis, unspecified: Secondary | ICD-10-CM | POA: Diagnosis not present

## 2022-09-12 MED ORDER — LEVOFLOXACIN 500 MG PO TABS: 500.0000 mg | ORAL_TABLET | Freq: Every day | ORAL | 0 refills | Status: DC

## 2022-09-12 NOTE — Discharge Instructions (Signed)
Start Levaquin once a day for 7 days.  Please contact the clinic or your PCP if you develop any joint or ligament/tendon pain Flonase daily Nasal rinses as tolerated Rest and fluids Follow-up with your PCP if your symptoms do not improve Please go to the emergency room if you have any worsening symptoms

## 2022-09-12 NOTE — ED Triage Notes (Signed)
Pt reports cough, nasal congestion and drainage x 2 weeks. OTC meds gives some relief.

## 2022-09-12 NOTE — ED Provider Notes (Signed)
[facility]    CSN: [id number] Arrival date & time: 09/12/22  [id number]      History   Chief Complaint Chief Complaint  Patient presents with   Cough    HPI Paula Huffman is a 52 y.o. female presents for evaluation of sinus pressure and pain.  Patient reports 2 weeks of sinus pressure and pain with purulent nasal discharge, postnasal drip, cough.  Denies any fevers but does have a low-grade fever in clinic.  No sore throat, shortness of breath, ear pain, body aches.  Does have a history of sinus infections.  Her husband has similar symptoms and is currently being treated for sinus infection.  She has been using some over-the-counter sinus medication with minimal relief.  No other concerns at this time.   Cough   Past Medical History:  Diagnosis Date   Anal fissure    Anemia    GERD (gastroesophageal reflux disease)    History of reactive airway disease    Hyperlipidemia    Hypertension    Thyroid disease    HYPOTHYROID    There are no problems to display for this patient.   Past Surgical History:  Procedure Laterality Date   APPENDECTOMY     AGE 41   BREAST SURGERY     LEFT BREAST EXCISION;CALCIFICATION REMOVED   CESAREAN SECTION     WISDOM TOOTH EXTRACTION      OB History     Gravida  1   Para      Term      Preterm      AB      Living  1      SAB      IAB      Ectopic      Multiple      Live Births               Home Medications    Prior to Admission medications   Medication Sig Start Date End Date Taking? Authorizing Provider  levofloxacin (LEVAQUIN) 500 MG tablet Take 1 tablet (500 mg total) by mouth daily. 09/12/22  Yes Melynda Ripple, NP  albuterol (VENTOLIN HFA) 108 (90 Base) MCG/ACT inhaler Inhale 1-2 puffs into the lungs every 6 (six) hours as needed for wheezing or shortness of breath. 07/01/22   Jaynee Eagles, PA-C  amLODipine (NORVASC) 5 MG tablet Take 5 mg by mouth daily.    [provider]  cetirizine  (ZYRTEC ALLERGY) 10 MG tablet Take 1 tablet (10 mg total) by mouth daily. 07/01/22   Jaynee Eagles, PA-C  COVID-19 mRNA bivalent vaccine, Pfizer, (PFIZER COVID-19 VAC BIVALENT) injection Inject into the muscle. 12/03/21   Carlyle Basques, MD  hydrochlorothiazide (HYDRODIURIL) 12.5 MG tablet Take 1 tablet (12.5 mg total) by mouth daily. 07/01/22   Jaynee Eagles, PA-C  levothyroxine (SYNTHROID, LEVOTHROID) 112 MCG tablet Take 112 mcg by mouth daily.    [provider]  norethindrone (MICRONOR,CAMILA,ERRIN) 0.35 MG tablet Take 1 tablet (0.35 mg total) by mouth daily. 04/28/12   Earnstine Regal, PA-C  predniSONE (DELTASONE) 20 MG tablet Take 2 tablets daily with breakfast. 07/01/22   Jaynee Eagles, PA-C  promethazine-dextromethorphan (PROMETHAZINE-DM) 6.25-15 MG/5ML syrup Take 5 mLs by mouth 3 (three) times daily as needed for cough. 07/01/22   Jaynee Eagles, PA-C  triamterene-hydrochlorothiazide (MAXZIDE-25) 37.5-25 MG per tablet Take 1 tablet by mouth daily.    [provider]    Family History Family History  Problem Relation Age of  Onset   Heart disease Father    Heart attack Father    Hypertension Father    Asthma Daughter    Heart attack Other    Thyroid disease Mother    Hypertension Mother    Diabetes Mother    Thyroid disease Maternal Aunt    Hypertension Paternal Aunt    Thyroid disease Maternal Grandmother    Hypertension Maternal Grandmother    Stroke Maternal Grandmother    Hypertension Maternal Grandfather    Hypertension Paternal Grandfather     Social History Social History   Tobacco Use   Smoking status: Never   Smokeless tobacco: Never  Substance Use Topics   Alcohol use: Yes   Drug use: No     Allergies   Penicillins, Azithromycin, Doxycycline, Hydrocodone-acetaminophen, and Vicodin [hydrocodone-acetaminophen]   Review of Systems Review of Systems  HENT:  Positive for congestion, postnasal drip, sinus pressure and sinus pain.   Respiratory:  Positive for  cough.      Physical Exam Triage Vital Signs ED Triage Vitals  Enc Vitals Group     BP 09/12/22 0936 (!) 149/78     Pulse Rate 09/12/22 0936 100     Resp 09/12/22 0936 18     Temp 09/12/22 0936 99.4 F (37.4 C)     Temp Source 09/12/22 0936 Oral     SpO2 09/12/22 0936 94 %     Weight --      Height --      Head Circumference --      Peak Flow --      Pain Score 09/12/22 0940 0     Pain Loc --      Pain Edu? --      Excl. in [place]? --    No data found.  Updated Vital Signs BP (!) 149/78 (BP Location: Right Arm)   Pulse 100   Temp 99.4 F (37.4 C) (Oral)   Resp 18   SpO2 94%   Visual Acuity Right Eye Distance:   Left Eye Distance:   Bilateral Distance:    Right Eye Near:   Left Eye Near:    Bilateral Near:     Physical Exam Vitals and nursing note reviewed.  Constitutional:      Appearance: Normal appearance.  HENT:     Head: Normocephalic and atraumatic.     Right Ear: Tympanic membrane and ear canal normal.     Left Ear: Tympanic membrane and ear canal normal.     Nose: Congestion present.     Right Turbinates: Swollen.     Left Turbinates: Swollen.     Right Sinus: Maxillary sinus tenderness present. No frontal sinus tenderness.     Left Sinus: Maxillary sinus tenderness present. No frontal sinus tenderness.     Mouth/Throat:     Mouth: Mucous membranes are moist.     Pharynx: Oropharynx is clear. No oropharyngeal exudate or posterior oropharyngeal erythema.  Eyes:     Pupils: Pupils are equal, round, and reactive to light.  Cardiovascular:     Rate and Rhythm: Normal rate and regular rhythm.     Heart sounds: Normal heart sounds.  Pulmonary:     Effort: Pulmonary effort is normal.     Breath sounds: Normal breath sounds.  Skin:    General: Skin is warm and dry.  Neurological:     General: No focal deficit present.     Mental Status: She is alert and oriented to person, place, and time.  Psychiatric:        Mood and Affect: Mood normal.         Behavior: Behavior normal.      UC Treatments / Results  Labs (all labs ordered are listed, but only abnormal results are displayed) Labs Reviewed - No data to display  EKG   Radiology No results found.  Procedures Procedures (including critical care time)  Medications Ordered in UC Medications - No data to display  Initial Impression / Assessment and Plan / UC Course  I have reviewed the triage vital signs and the nursing notes.  Pertinent labs & imaging results that were available during my care of the patient were reviewed by me and considered in my medical decision making (see chart for details).     Reviewed exam and symptoms with patient.  No red flags Discussed sinus infection.  Patient has multiple allergies including penicillin, Zithromax, doxycycline.  She has been treated with Cipro in the past for sinusitis and tolerated well.  Will start Levaquin daily for 7 days.  Side effect profile ( tendon rupture) reviewed in length and patient verbalized understanding and is in agreement to continue with plan of care Flonase daily Nasal rinses as tolerated Follow-up with PCP if symptoms or not improving Please go to the emergency room for any worsening symptoms Final Clinical Impressions(s) / UC Diagnoses   Final diagnoses:  Sinobronchitis     Discharge Instructions      Start Levaquin once a day for 7 days.  Please contact the clinic or your PCP if you develop any joint or ligament/tendon pain Flonase daily Nasal rinses as tolerated Rest and fluids Follow-up with your PCP if your symptoms do not improve Please go to the emergency room if you have any worsening symptoms   ED Prescriptions     Medication Sig Dispense Auth. Provider   levofloxacin (LEVAQUIN) 500 MG tablet Take 1 tablet (500 mg total) by mouth daily. 7 tablet Melynda Ripple, NP      PDMP not reviewed this encounter.   Melynda Ripple, NP 09/12/22 1003

## 2022-12-01 IMAGING — MG MM DIGITAL DIAGNOSTIC UNILAT*R* W/ TOMO W/ CAD
4 series · 4 of 12 positions shown · non-contrast
Comparison: Previous exam(s).

CLINICAL DATA: Short-term follow-up for probably benign right
breast asymmetry, evaluated with diagnostic imaging on 05/19/2021,
with no sonographic correlate. Short-term follow-up was recommended.

EXAM:
DIGITAL DIAGNOSTIC UNILATERAL RIGHT MAMMOGRAM WITH TOMOSYNTHESIS AND
CAD
TECHNIQUE: Right digital diagnostic mammography and breast tomosynthesis was
performed. The images were evaluated with computer-aided detection.

[R MLO synth-2D]
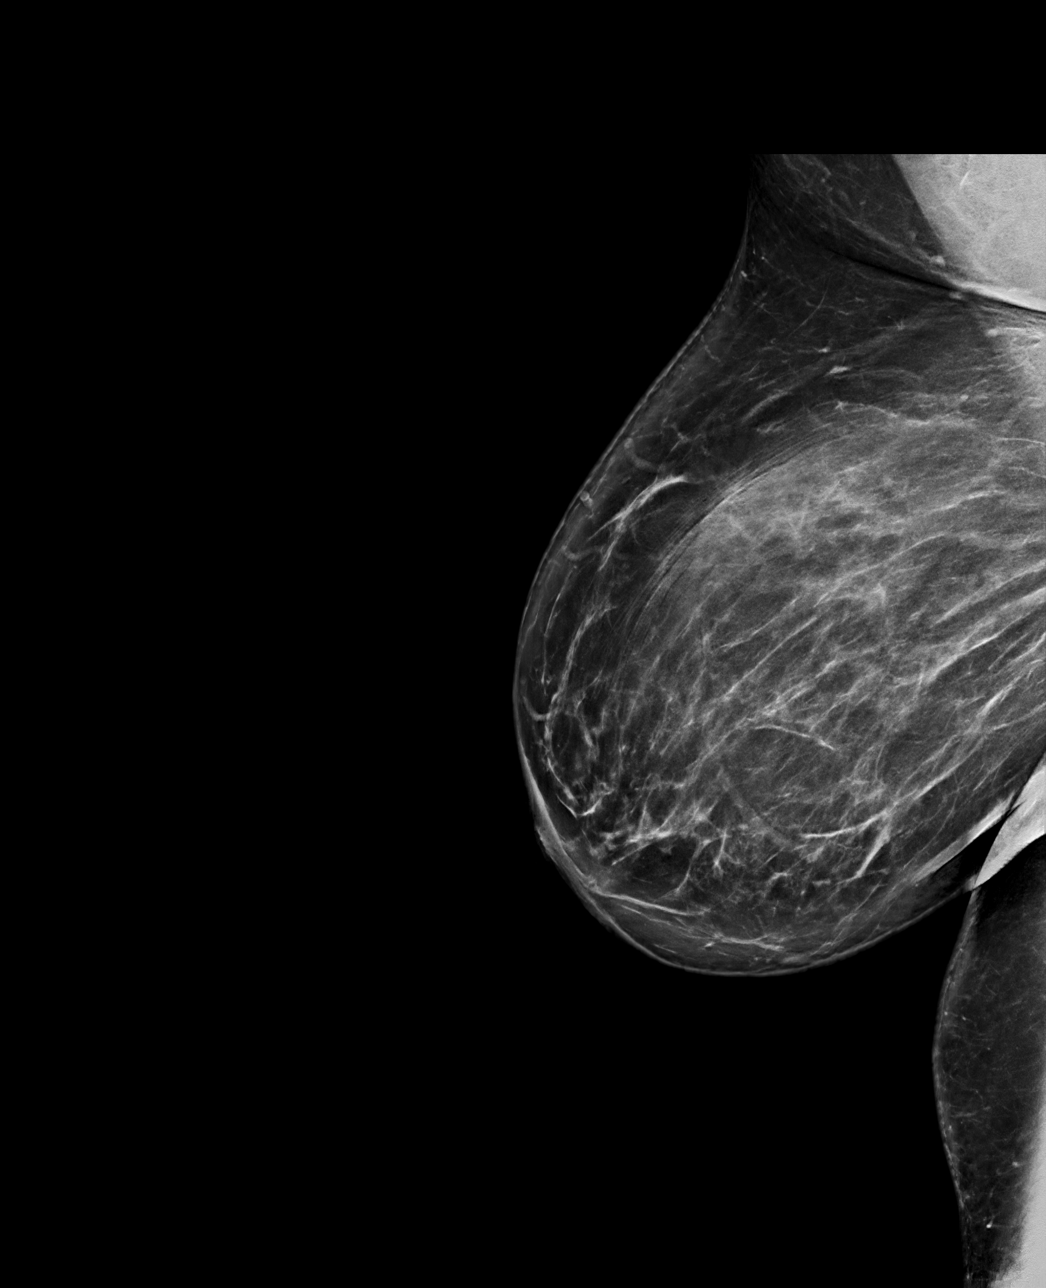

[R CC synth-2D]
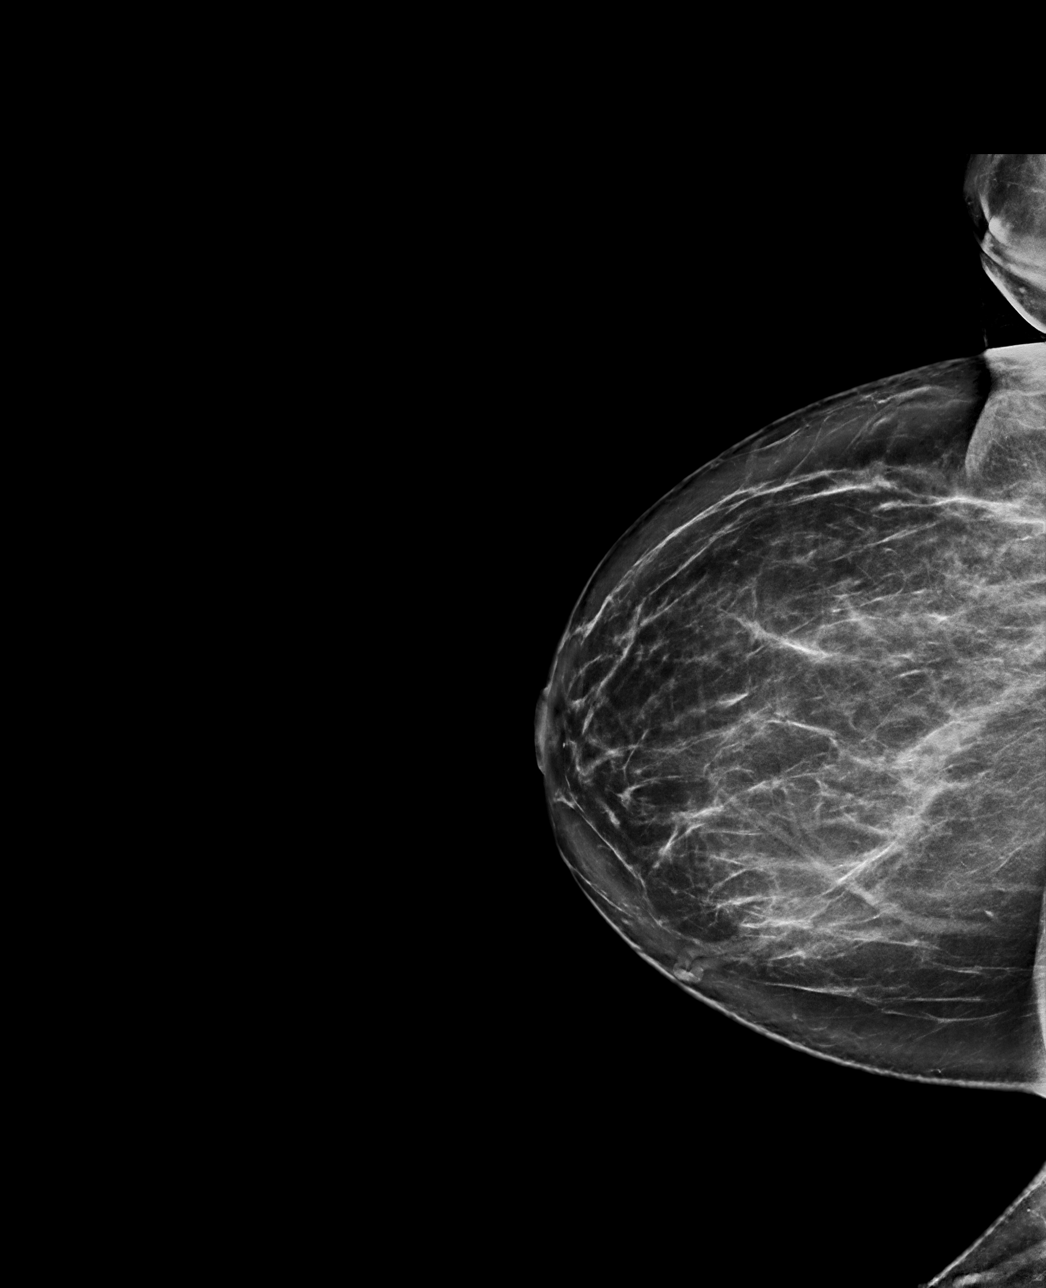

[R MLO tomo · tomo slice 49/97.0]
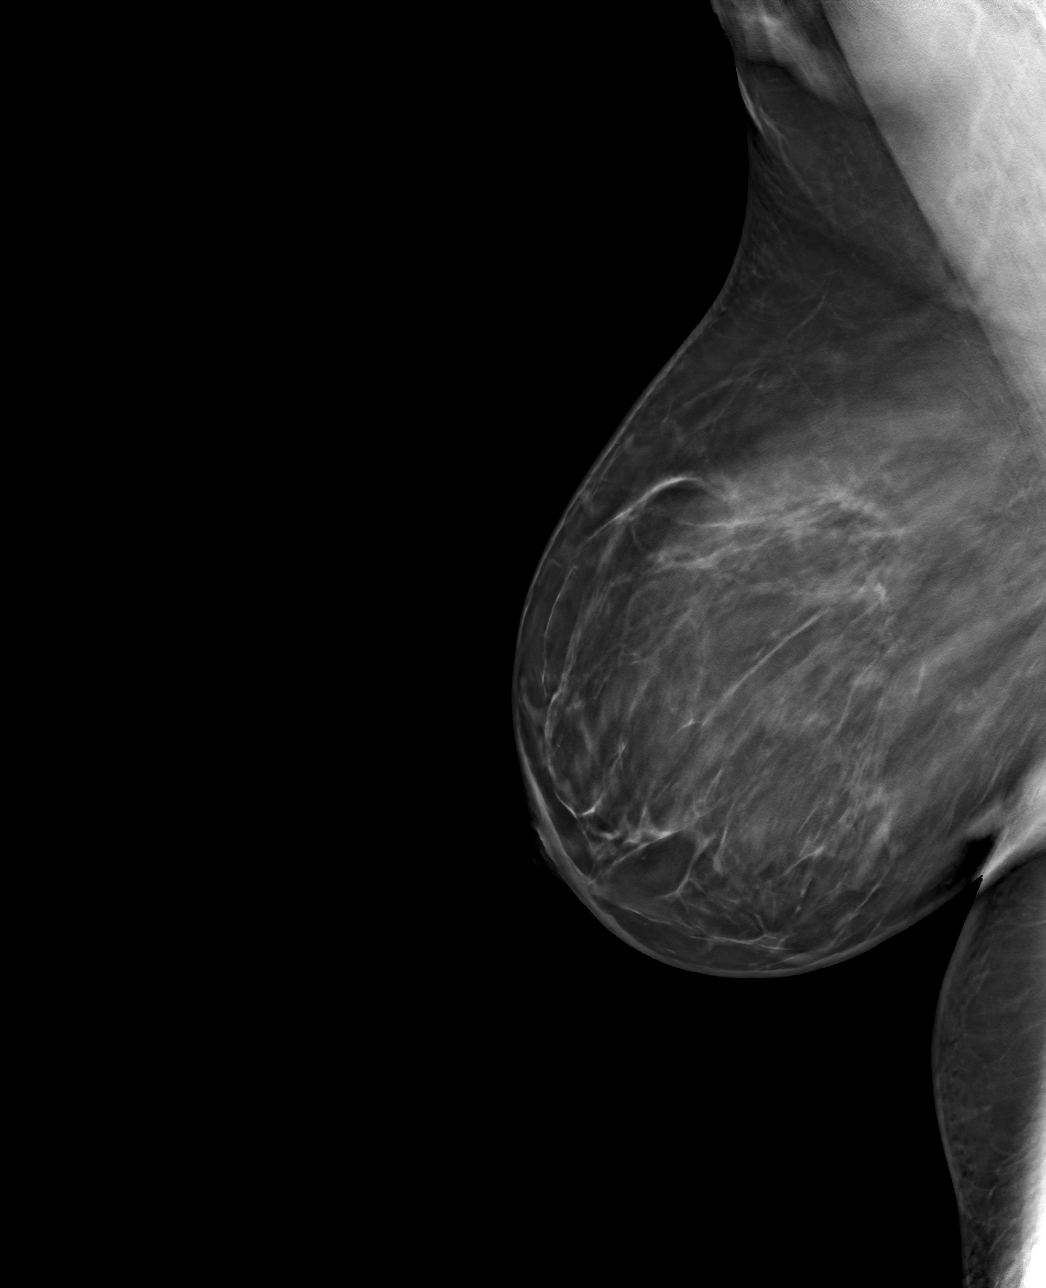

[R CC tomo · tomo slice 45/89.0]
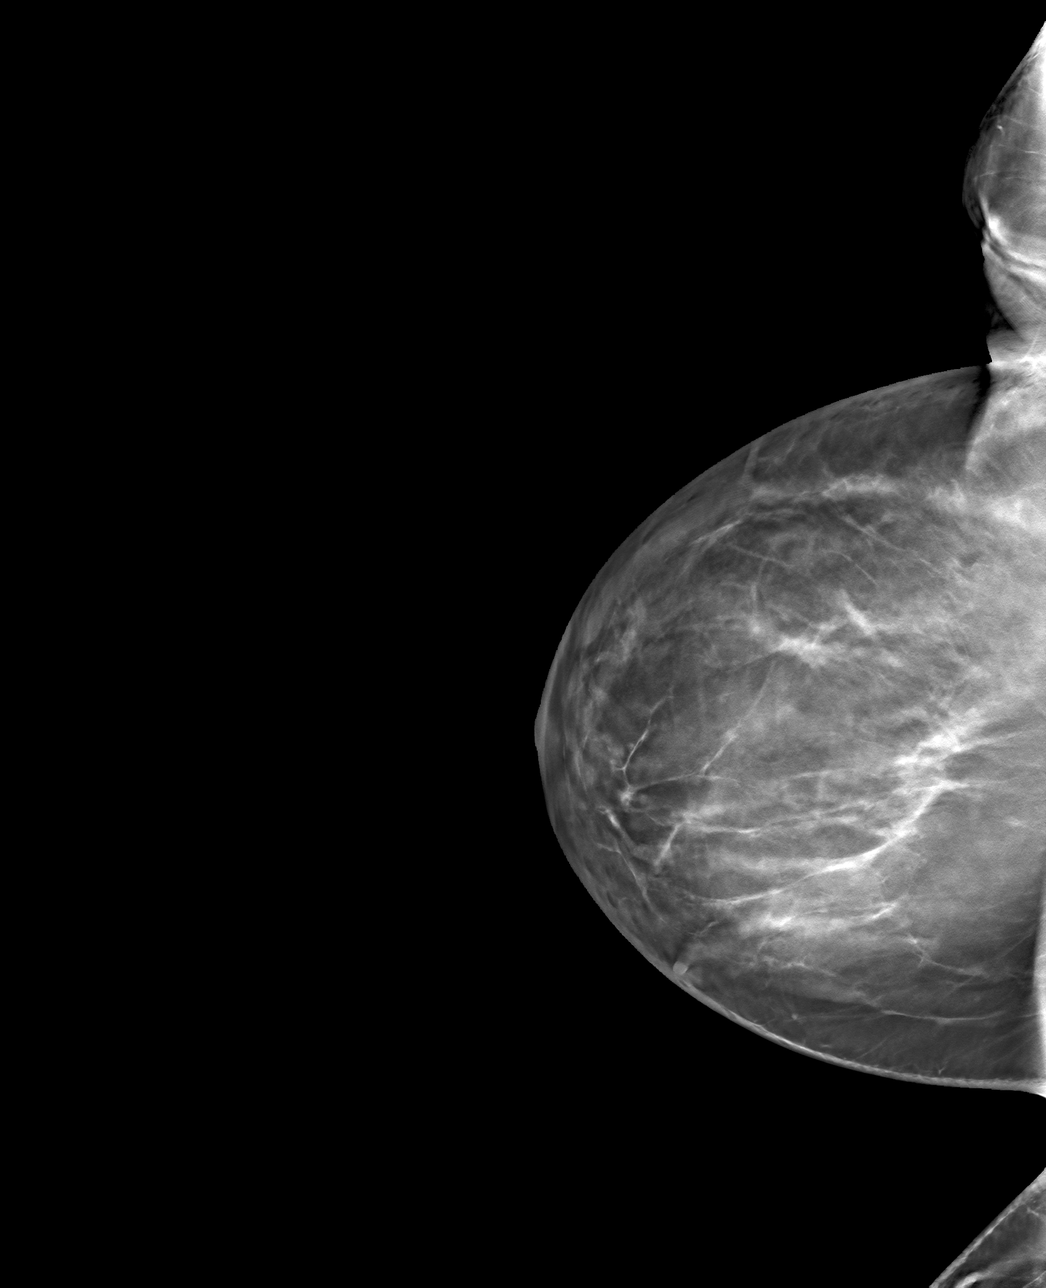

[4 of 12 positions shown; findings below may reference images not displayed]

ACR Breast Density Category b: There are scattered areas of
fibroglandular density.
FINDINGS: The possible asymmetry, which was noted in the posteromedial right
breast, is no longer appreciated. There is no defined mass or
significant asymmetry. There are no areas of architectural
distortion and no suspicious calcifications.
IMPRESSION: No evidence of breast malignancy.

RECOMMENDATION:
Annual screening mammography.  Last screening exam dated 04/19/2021.

I have discussed the findings and recommendations with the patient.
If applicable, a reminder letter will be sent to the patient
regarding the next appointment.

BI-RADS CATEGORY  1: Negative.

## 2023-02-13 ENCOUNTER — Encounter (INDEPENDENT_AMBULATORY_CARE_PROVIDER_SITE_OTHER): Payer: Self-pay

## 2023-03-12 ENCOUNTER — Ambulatory Visit (INDEPENDENT_AMBULATORY_CARE_PROVIDER_SITE_OTHER)
Admission: RE | Admit: 2023-03-12 | Discharge: 2023-03-12 | Disposition: A | Discharge status: Home / Self Care | Payer: BC Managed Care – PPO | Source: Ambulatory Visit | Attending: Physician Assistant | Admitting: Physician Assistant

## 2023-03-12 ENCOUNTER — Ambulatory Visit: Payer: BC Managed Care – PPO | Admitting: Physician Assistant

## 2023-03-12 ENCOUNTER — Other Ambulatory Visit: Payer: Self-pay

## 2023-03-12 ENCOUNTER — Encounter: Payer: Self-pay | Admitting: Physician Assistant

## 2023-03-12 VITALS — BP 147/78 | HR 90 | Temp 97.8°F | Ht 63.78 in | Wt 198.8 lb

## 2023-03-12 DIAGNOSIS — E1165 Type 2 diabetes mellitus with hyperglycemia: Secondary | ICD-10-CM | POA: Diagnosis not present

## 2023-03-12 DIAGNOSIS — R768 Other specified abnormal immunological findings in serum: Secondary | ICD-10-CM

## 2023-03-12 DIAGNOSIS — I10 Essential (primary) hypertension: Secondary | ICD-10-CM | POA: Diagnosis not present

## 2023-03-12 DIAGNOSIS — E782 Mixed hyperlipidemia: Secondary | ICD-10-CM | POA: Diagnosis not present

## 2023-03-12 DIAGNOSIS — R062 Wheezing: Secondary | ICD-10-CM

## 2023-03-12 DIAGNOSIS — E890 Postprocedural hypothyroidism: Secondary | ICD-10-CM | POA: Diagnosis not present

## 2023-03-12 DIAGNOSIS — Z23 Encounter for immunization: Secondary | ICD-10-CM | POA: Diagnosis not present

## 2023-03-12 DIAGNOSIS — M2550 Pain in unspecified joint: Secondary | ICD-10-CM

## 2023-03-12 DIAGNOSIS — Z7984 Long term (current) use of oral hypoglycemic drugs: Secondary | ICD-10-CM

## 2023-03-12 LAB — CBC WITH DIFFERENTIAL/PLATELET
Basophils Absolute: 0 10*3/uL (ref 0.0–0.1)
Basophils Relative: 0.9 % (ref 0.0–3.0)
Eosinophils Absolute: 0.1 10*3/uL (ref 0.0–0.7)
Eosinophils Relative: 1.8 % (ref 0.0–5.0)
HCT: 40.4 % (ref 36.0–46.0)
Hemoglobin: 12.9 g/dL (ref 12.0–15.0)
Lymphocytes Relative: 39.4 % (ref 12.0–46.0)
Lymphs Abs: 2.3 10*3/uL (ref 0.7–4.0)
MCHC: 31.9 g/dL (ref 30.0–36.0)
MCV: 81.7 fl (ref 78.0–100.0)
Monocytes Absolute: 0.4 10*3/uL (ref 0.1–1.0)
Monocytes Relative: 7.2 % (ref 3.0–12.0)
Neutro Abs: 2.9 10*3/uL (ref 1.4–7.7)
Neutrophils Relative %: 50.7 % (ref 43.0–77.0)
Platelets: 299 10*3/uL (ref 150.0–400.0)
RBC: 4.94 Mil/uL (ref 3.87–5.11)
RDW: 14.2 % (ref 11.5–15.5)
WBC: 5.8 10*3/uL (ref 4.0–10.5)

## 2023-03-12 LAB — COMPREHENSIVE METABOLIC PANEL
ALT: 15 U/L (ref 0–35)
AST: 13 U/L (ref 0–37)
Albumin: 4.1 g/dL (ref 3.5–5.2)
Alkaline Phosphatase: 77 U/L (ref 39–117)
BUN: 15 mg/dL (ref 6–23)
CO2: 29 meq/L (ref 19–32)
Calcium: 9.3 mg/dL (ref 8.4–10.5)
Chloride: 101 meq/L (ref 96–112)
Creatinine, Ser: 0.86 mg/dL (ref 0.40–1.20)
GFR: 76.73 mL/min (ref >60.00)
Glucose, Bld: 113 mg/dL — ABNORMAL HIGH (ref 70–99)
Potassium: 3.3 meq/L — ABNORMAL LOW (ref 3.5–5.1)
Sodium: 138 meq/L (ref 135–145)
Total Bilirubin: 0.3 mg/dL (ref 0.2–1.2)
Total Protein: 7.7 g/dL (ref 6.0–8.3)

## 2023-03-12 LAB — SEDIMENTATION RATE: Sed Rate: 56 mm/h — ABNORMAL HIGH (ref 0–30)

## 2023-03-12 LAB — LIPID PANEL
Cholesterol: 237 mg/dL — ABNORMAL HIGH (ref 0–200)
HDL: 50.3 mg/dL (ref >39.00)
LDL Cholesterol: 131 mg/dL — ABNORMAL HIGH (ref 0–99)
NonHDL: 186.65
Total CHOL/HDL Ratio: 5
Triglycerides: 278 mg/dL — ABNORMAL HIGH (ref 0.0–149.0)
VLDL: 55.6 mg/dL — ABNORMAL HIGH (ref 0.0–40.0)

## 2023-03-12 LAB — TSH: TSH: 1.13 u[IU]/mL (ref 0.35–5.50)

## 2023-03-12 LAB — HEMOGLOBIN A1C: Hgb A1c MFr Bld: 7.7 % — ABNORMAL HIGH (ref 4.6–6.5)

## 2023-03-12 LAB — C-REACTIVE PROTEIN: CRP: <1 mg/dL (ref 0.5–20.0)

## 2023-03-12 MED ORDER — LEVOTHYROXINE SODIUM 137 MCG PO TABS: 137.0000 ug | ORAL_TABLET | Freq: Every day | ORAL | 3 refills | Status: DC

## 2023-03-12 MED ORDER — GLUCOSE BLOOD VI STRP: ORAL_STRIP | 2 refills | Status: AC

## 2023-03-12 MED ORDER — METFORMIN HCL ER 500 MG PO TB24: 1000.0000 mg | ORAL_TABLET | Freq: Every day | ORAL | 1 refills | Status: DC

## 2023-03-12 MED ORDER — AMLODIPINE BESYLATE 5 MG PO TABS: 5.0000 mg | ORAL_TABLET | Freq: Every day | ORAL | 3 refills | Status: DC

## 2023-03-12 NOTE — Progress Notes (Signed)
Subjective:    Patient ID: Paula Huffman, female    DOB: [birth year], 52 y.o.   MRN: [id number]  Chief Complaint  Patient presents with   Establish Care    Pt states that she has Hypertension, High Cholesterol, and Diabetes. Would like help with managing .   Joint Pain    Ankle, Feet, Hips, Wrist Thumb side pain, gradual over last several years .     HPI 52 y.o. patient presents today for new patient establishment with me.  Patient was previously established with [facility] physicians.  Current Care Team: GYN GI - Dr. Loreta Ave  [facility]   Acute Concerns: Increasing joint pain - knuckles, wrists, hips, knees, thumbs, feet, and ankles. Sometimes worse with sitting down. Stiffness for several years. No family history of autoimmune issues. 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   Back injury since teenage years and then a few years ago  Occasional palpitations  - happened in past with hyperthyroidism, heart monitor at that time was normal   Chronic Concerns: See PMH listed below, as well as A/P for details on issues we specifically discussed during today's visit.      Past Medical History:  Diagnosis Date   Anal fissure    Anemia    GERD (gastroesophageal reflux disease)    History of reactive airway disease    Hyperlipidemia    Hypertension    Thyroid disease    HYPOTHYROID    Past Surgical History:  Procedure Laterality Date   APPENDECTOMY     AGE 51   BREAST SURGERY     LEFT BREAST EXCISION;CALCIFICATION REMOVED   CESAREAN SECTION     WISDOM TOOTH EXTRACTION      Family History  Problem Relation Age of Onset   Heart disease Father    Heart attack Father    Hypertension Father    Asthma Daughter    Heart attack Other    Thyroid disease Mother    Hypertension Mother    Diabetes Mother    Thyroid disease Maternal Aunt    Hypertension Paternal Aunt    Thyroid disease Maternal Grandmother     Hypertension Maternal Grandmother    Stroke Maternal Grandmother    Hypertension Maternal Grandfather    Hypertension Paternal Grandfather     Social History   Tobacco Use   Smoking status: Never   Smokeless tobacco: Never  Substance Use Topics   Alcohol use: Yes   Drug use: No     Allergies  Allergen Reactions   Penicillins Other (See Comments) and Rash   Azithromycin Diarrhea, Other (See Comments) and Nausea And Vomiting   Doxycycline    Hydrocodone-Acetaminophen Other (See Comments)   Vicodin [Hydrocodone-Acetaminophen]     Review of Systems NEGATIVE UNLESS OTHERWISE INDICATED IN HPI      Objective:     BP (!) 147/78   Pulse 90   Temp 97.8 F (36.6 C) (Temporal)   Ht 5' 3.78" (1.62 m)   Wt 198 lb 12.8 oz (90.2 kg)   LMP 08/14/2022 (Approximate)   SpO2 97%   BMI 34.36 kg/m   Wt Readings from Last 3 Encounters:  03/12/23 198 lb 12.8 oz (90.2 kg)  04/28/12 183 lb (83 kg)    BP Readings from Last 3 Encounters:  03/12/23 (!) 147/78  09/12/22 (!) 149/78  07/01/22 (!) 177/96     Physical Exam Vitals and nursing note reviewed.  Constitutional:      Appearance: Normal appearance. She is normal weight. She is not toxic-appearing.  HENT:     Head: Normocephalic and atraumatic.     Right Ear: External ear normal.     Left Ear: External ear normal.     Mouth/Throat:     Mouth: Mucous membranes are moist.  Eyes:     Extraocular Movements: Extraocular movements intact.     Conjunctiva/sclera: Conjunctivae normal.     Pupils: Pupils are equal, round, and reactive to light.  Cardiovascular:     Rate and Rhythm: Normal rate and regular rhythm.     Pulses: Normal pulses.     Heart sounds: Normal heart sounds.  Pulmonary:     Effort: Pulmonary effort is normal.     Breath sounds: Wheezing (faint expiratory) present.  Abdominal:     General: Abdomen is flat. Bowel sounds are normal.     Palpations: Abdomen is soft.  Musculoskeletal:        General: No  swelling, tenderness or deformity. Normal range of motion.     Cervical back: Normal range of motion and neck supple.  Skin:    General: Skin is warm and dry.  Neurological:     General: No focal deficit present.     Mental Status: She is alert and oriented to person, place, and time.  Psychiatric:        Mood and Affect: Mood normal.        Behavior: Behavior normal.        Thought Content: Thought content normal.        Judgment: Judgment normal.        Assessment & Plan:  Postablative hypothyroidism Assessment & Plan: Lab Results  Component Value Date   TSH 1.13 03/12/2023   Currently taking levothyroxine 137 mcg qAM Lab today, adjust prn   Orders: -     Levothyroxine Sodium; Take 1 tablet (137 mcg total) by mouth daily before breakfast.  Dispense: 90 tablet; Refill: 3 -     TSH  Essential hypertension Assessment & Plan: Slightly elevated Monitor at home Refilled amlodipine 5 mg and Benicar HCT 20-12.5 mg daily  Adjust prn   Orders: -     amLODIPine Besylate; Take 1 tablet (5 mg total) by mouth daily.  Dispense: 90 tablet; Refill: 3 -     CBC with Differential/Platelet -     Comprehensive metabolic panel -     Lipid panel  Type 2 diabetes mellitus with hyperglycemia, without long-term current use of insulin (HCC) Assessment & Plan: Update labs today Refilled Metformin XR 500 mg BID  Work on lifestyle   Orders: -     Comprehensive metabolic panel -     Hemoglobin A1c  Expiratory wheezing -     DG Chest 2 View; Future  Polyarthralgia -     CBC with Differential/Platelet -     Comprehensive metabolic panel -     TSH -     Hemoglobin A1c -     ANA -     Rheumatoid factor -     Sedimentation rate -     C-reactive protein  Mixed hyperlipidemia -     Lipid panel  Immunization due -     Flu vaccine trivalent PF, 6mos and older(Flulaval,Afluria,Fluarix,Fluzone)  Other orders -     Anti-nuclear ab-titer (ANA titer)        Return in about 2  weeks (around 03/26/2023) for recheck/follow-up - discuss labs  and CXR .    Lorik Guo M Semaya Vida, PA-C

## 2023-03-12 NOTE — Patient Instructions (Signed)
Welcome to [facility]! It was a pleasure meeting you today.  Labs today at our office  Chest XRAY at [facility]  An order for xrays has been put in for you. To have this done, you can walk in at the [facility] location without a scheduled appointment.  The address is [address] It is across the street from [facility]. Lab and x-xray are located in the basement.    Hours of operation are M-F 8:30am to 5:00pm.  Please note that they are closed for lunch between 12:30 and 1:00pm.   PLEASE NOTE:  If you had any LAB tests please let us know if you have not heard back within a few days. You may see your results on MyChart before we have a chance to review them but we will give you a call once they are reviewed by [place]. If we ordered any REFERRALS today, please let us know if you have not heard from their office within the next two weeks. Let us know through MyChart if you are needing REFILLS, or have your pharmacy send [place] the request. You can also use MyChart to communicate with me or any office staff.  Please try these tips to maintain a healthy lifestyle:  Eat most of your calories during the day when you are active. Eliminate processed foods including packaged sweets (pies, cakes, cookies), reduce intake of potatoes, white bread, white pasta, and white rice. Look for whole grain options, oat flour or almond flour.  Each meal should contain half fruits/vegetables, one quarter protein, and one quarter carbs (no bigger than a computer mouse).  Cut down on sweet beverages. This includes juice, soda, and sweet tea. Also watch fruit intake, though this is a healthier sweet option, it still contains natural sugar! Limit to 3 servings daily.  Drink at least 1 glass of water with each meal and aim for at least 8 glasses (64 ounces) per day.  Exercise at least 150 minutes every week to the best of your ability.    Take Care,  Remy Dia, PA-C

## 2023-03-15 LAB — ANTI-NUCLEAR AB-TITER (ANA TITER): ANA Titer 1: 1:40 {titer} — ABNORMAL HIGH

## 2023-03-15 LAB — RHEUMATOID FACTOR: Rheumatoid fact SerPl-aCnc: <10 [IU]/mL (ref <14)

## 2023-03-15 LAB — ANA: Anti Nuclear Antibody (ANA): POSITIVE — AB

## 2023-03-17 NOTE — Assessment & Plan Note (Signed)
Lab Results  Component Value Date   TSH 1.13 03/12/2023   Currently taking levothyroxine 137 mcg qAM Lab today, adjust prn

## 2023-03-17 NOTE — Assessment & Plan Note (Signed)
Slightly elevated Monitor at home Refilled amlodipine 5 mg and Benicar HCT 20-12.5 mg daily  Adjust prn

## 2023-03-17 NOTE — Assessment & Plan Note (Signed)
Update labs today Refilled Metformin XR 500 mg BID  Work on lifestyle

## 2023-03-18 NOTE — Addendum Note (Signed)
Addended by: Filomena Jungling on: 03/18/2023 09:35 AM   Modules accepted: Orders

## 2023-03-19 ENCOUNTER — Encounter: Payer: Self-pay | Admitting: Pharmacist

## 2023-03-21 ENCOUNTER — Encounter: Payer: Self-pay | Admitting: Pharmacist

## 2023-03-21 ENCOUNTER — Telehealth: Payer: Self-pay | Admitting: Pharmacist

## 2023-03-21 LAB — HM COLONOSCOPY

## 2023-03-21 NOTE — Telephone Encounter (Signed)
Received message below regarding patient having tolerability issues with blood pressure medications. Unable to reach patient with initial outreach. Unable to leave message.      Alden Hipp, RPH-CPP routed conversation to [place] days ago   [employer], RPH-CPP2 days ago    I didn't take meds on morning of my appt. with Ms. Alyssa.  I take every morning, I will remember to take morning of appt.  Sometimes, since taking Olmesartan, I feel uncomfortable. Sometimes "palpitatey" or heart beats an offbeat occasionally.  I told Ms. Alyssa that I have had trouble with other .Marland KitchenMarland KitchenSartan blood pressure meds for this and other reasons.     Alden Hipp, RPH-CPP  Marijah Amie Critchley Blue2 days ago                                                       [facility],    Our records indicate that you have an upcoming appointment at your Primary Care Provider's office and previously have had an elevated blood pressure reading.    If you have problems getting or taking your blood pressure medications for the following reasons, please respond to this message and let me know:    - Money concerns (or being able to afford your medications) - Transportation issues - Not having refills - Feeling sick - Forgetting to take your medication  - Having questions or concerns about your medications   Even if you have been asked to fast for lab work prior to this appointment, please make sure you take any prescribed blood pressure medications.    If you have a home blood pressure machine, you can bring it with you to this appointment to compare to our clinic blood pressure machine. Bring any documented home blood pressure readings with you to the appointment to show your provider.    Please also let us know if you do not have transportation for the upcoming appointment - we can help you with this.    We look forward to seeing you!   Catie Clearance Coots,  PharmD [facility]

## 2023-03-21 NOTE — Progress Notes (Signed)
Patient appearing on report for [facility] - Hypertension Control report due to last documented ambulatory blood pressure of 144/78 on 03/12/2023. Next appointment with PCP is 03/28/2023  Outreached patient to discuss hypertension control and medication management.   Current antihypertensives: amlodipine 5mg  daily; olmesartan hydrochlorothiazide 20/12.5mg  daily  Past medications tried: triamterene-hydrochlorothiazide - felt good on this med but stopped due possible increase in blood glucose;  losartan, valsartan and other "sartans" - caused respiratory issues / cough.   Patient has an automated upper arm home BP machine.  Current blood pressure readings:No recent home blood pressure readings to report but this am at colonoscopy was 136/78 per patient  BP Readings from Last 3 Encounters:  03/12/23 (!) 147/78  09/12/22 (!) 149/78  07/01/22 (!) 177/96    Current meal patterns: trying to limit salt intake, though she might be getting from some foods.  Does not add salt to foods. She tries to read labels.   Current physical activity: working on increasing exercise. Last week worked out twice. Not this week due to colonoscopy. Walking on treadmill and stretching exercises. Usually total time is 50 minutes.   Patient denies hypotensive signs and symptoms including no dizziness, lightheadedness.  Patient denies hypertensive symptoms including no headache, chest pain, shortness of breath.  Patient reports side effects related to olmesartan hydrochlorothiazide - feels like      Assessment/Plan: - Currently controlled but patient reported blood pressure prior to colonoscopy today but last office blood pressure was > 130/80 - - Reviewed appropriate administration of medication regimen - Reviewed appropriate home BP monitoring technique (avoid caffeine, smoking, and exercise for 30 minutes before checking, rest for at least 5 minutes before taking BP, sit with feet flat on the floor and  back against a hard surface, uncross legs, and rest arm on flat surface) - Discussed dietary modifications, such as reduced salt intake, focus on whole grains, vegetables, lean proteins - Discussed goal of 150 minutes of moderate intensity physical activity weekly - Recommend continue current therapy - amlodipine and olmesartan hydrochlorothiazide . Will send message to PCP prior to appointment next week.  Possible alternative medications to olmesartan-hydrochlorothiazide:   Spironolactone 25mg  daily  Retry ACEI + hydrochlorothiazide and monitor for respiratory side effects.   Retry triamterene- hydrochlorothiazide and monitor blood glucose.   Beta blockers is an option but I worry that it could affect her exercise tolerance.   Henrene Pastor, PharmD Clinical Pharmacist [facility]  Population Health [phone]

## 2023-03-25 ENCOUNTER — Encounter: Payer: Self-pay | Admitting: Physician Assistant

## 2023-03-28 ENCOUNTER — Encounter: Payer: Self-pay | Admitting: Physician Assistant

## 2023-03-28 ENCOUNTER — Telehealth (INDEPENDENT_AMBULATORY_CARE_PROVIDER_SITE_OTHER): Payer: BC Managed Care – PPO | Admitting: Physician Assistant

## 2023-03-28 VITALS — Ht 63.0 in | Wt 198.0 lb

## 2023-03-28 DIAGNOSIS — I10 Essential (primary) hypertension: Secondary | ICD-10-CM

## 2023-03-28 DIAGNOSIS — E1165 Type 2 diabetes mellitus with hyperglycemia: Secondary | ICD-10-CM

## 2023-03-28 DIAGNOSIS — I517 Cardiomegaly: Secondary | ICD-10-CM | POA: Diagnosis not present

## 2023-03-28 DIAGNOSIS — E782 Mixed hyperlipidemia: Secondary | ICD-10-CM | POA: Insufficient documentation

## 2023-03-28 DIAGNOSIS — R002 Palpitations: Secondary | ICD-10-CM

## 2023-03-28 DIAGNOSIS — R0600 Dyspnea, unspecified: Secondary | ICD-10-CM

## 2023-03-28 MED ORDER — PRAVASTATIN SODIUM 40 MG PO TABS
ORAL_TABLET | ORAL | 2 refills | Status: DC
Start: 2023-03-28 — End: 2023-07-03

## 2023-03-28 MED ORDER — SPIRONOLACTONE 25 MG PO TABS: 25.0000 mg | ORAL_TABLET | Freq: Every day | ORAL | 2 refills | Status: DC

## 2023-03-28 NOTE — Progress Notes (Unsigned)
Virtual Visit via Video Note  I connected with  Paula Huffman  on 03/31/23 at 12:30 PM EDT by a video enabled telemedicine application and verified that I am speaking with the correct person using two identifiers.  Location: Patient: home Provider: Nature conservation officer at [employer] Persons present: Patient and myself   I discussed the limitations of evaluation and management by telemedicine and the availability of in person appointments. The patient expressed understanding and agreed to proceed.   History of Present Illness:  52 year old female presenting for virtual visit to discuss recent lab results and chest x-ray findings. / f/up from last visit.   Sometimes having palpitations with olmesartan-hydrochlorothiazide - noticed when she first started this medication, and also having noticing it more this year.   Feels good with working out[facility] No SOB or chest pain.   90 seconds running up the stairs some windedness and then goes away.  More tired out at home / house chores at times.   More exercise and stretching seems to help relieve joint pain and stiffness. Declines any additional medication at this time.   Currently on a statin medication 2-3 times per week, but couldn't tolerate more than that due to muscle pains.    Observations/Objective:   Gen: Awake, alert, no acute distress Resp: Breathing is even and non-labored Psych: calm/pleasant demeanor Neuro: Alert and Oriented x 3, + facial symmetry, speech is clear.   Assessment and Plan:  Problem List Items Addressed This Visit       Cardiovascular and Mediastinum   Essential hypertension - Primary    Stop the olmesartan-hydrochlorothiazide, start instead on Spironolactone 25 mg for HTN.      Relevant Medications   spironolactone (ALDACTONE) 25 MG tablet   pravastatin (PRAVACHOL) 40 MG tablet   Heart enlargement    Noted on recent CXR Plan on ECHO for further eval        Relevant Medications   spironolactone (ALDACTONE) 25 MG tablet   pravastatin (PRAVACHOL) 40 MG tablet   Other Relevant Orders   ECHOCARDIOGRAM COMPLETE   LONG TERM MONITOR (3-14 DAYS)     Endocrine   Type 2 diabetes mellitus with hyperglycemia, without long-term current use of insulin (HCC)   Relevant Medications   pravastatin (PRAVACHOL) 40 MG tablet     Other   Mixed hyperlipidemia    Stop rosuvastatin; start on pravastatin instead - let me know if any muscle aches or pains.  The 10-year ASCVD risk score (Arnett DK, et al., 2019) is: 21.4%   Values used to calculate the score:     Age: 25 years     Sex: Female     Is Non-Hispanic African American: Yes     Diabetic: Yes     Tobacco smoker: No     Systolic Blood Pressure: 147 mmHg     Is BP treated: Yes     HDL Cholesterol: 50.3 mg/dL     Total Cholesterol: 237 mg/dL       Relevant Medications   spironolactone (ALDACTONE) 25 MG tablet   pravastatin (PRAVACHOL) 40 MG tablet   Other Visit Diagnoses     Dyspnea, unspecified type       Relevant Orders   ECHOCARDIOGRAM COMPLETE   LONG TERM MONITOR (3-14 DAYS)   Palpitations       Relevant Orders   LONG TERM MONITOR (3-14 DAYS)        Follow Up Instructions:  I discussed the assessment and treatment plan with the patient. The patient was provided an opportunity to ask questions and all were answered. The patient agreed with the plan and demonstrated an understanding of the instructions.   The patient was advised to call back or seek an in-person evaluation if the symptoms worsen or if the condition fails to improve as anticipated.  Kramer Hanrahan M Diontae Route, PA-C

## 2023-03-28 NOTE — Patient Instructions (Addendum)
Stop rosuvastatin; start on pravastatin instead - let me know if any muscle aches or pains.  Stop the olmesartan-hydrochlorothiazide, start instead on Spironolactone 25 mg for HTN.  ECHO ordered - they will call to schedule  Zio patch ordered - will be mailed to your house.   Keep up the great work!

## 2023-03-31 DIAGNOSIS — I517 Cardiomegaly: Secondary | ICD-10-CM | POA: Insufficient documentation

## 2023-03-31 NOTE — Assessment & Plan Note (Signed)
Stop the olmesartan-hydrochlorothiazide, start instead on Spironolactone 25 mg for HTN.

## 2023-03-31 NOTE — Assessment & Plan Note (Signed)
Noted on recent CXR Plan on ECHO for further eval

## 2023-03-31 NOTE — Assessment & Plan Note (Signed)
Stop rosuvastatin; start on pravastatin instead - let me know if any muscle aches or pains.  The 10-year ASCVD risk score (Arnett DK, et al., 2019) is: 21.4%   Values used to calculate the score:     Age: 52 years     Sex: Female     Is Non-Hispanic African American: Yes     Diabetic: Yes     Tobacco smoker: No     Systolic Blood Pressure: 147 mmHg     Is BP treated: Yes     HDL Cholesterol: 50.3 mg/dL     Total Cholesterol: 237 mg/dL

## 2023-04-15 ENCOUNTER — Encounter: Payer: Self-pay | Admitting: Physician Assistant

## 2023-04-15 NOTE — Telephone Encounter (Signed)
I have spoken with patient in regard.    The referrals team will close the gap on the referral tomorrow and follow back up with patient in regard.  Richardine Service will get an ETA on the heart monitor tomorrow and follow back up with patient in regard.

## 2023-04-15 NOTE — Telephone Encounter (Signed)
Patient states she has spoken with front desk on two recent occasions on recent referral. Notes placed in the referral but nothing sent to clinical team in regards to concerns. Referral was placed on 03/18/23. Is there anyone on referral team that can get this scheduled and contact the patient. Thanks

## 2023-04-16 ENCOUNTER — Other Ambulatory Visit: Payer: Self-pay | Admitting: Physician Assistant

## 2023-04-16 ENCOUNTER — Ambulatory Visit: Payer: BC Managed Care – PPO | Attending: Physician Assistant

## 2023-04-16 DIAGNOSIS — R0600 Dyspnea, unspecified: Secondary | ICD-10-CM

## 2023-04-16 DIAGNOSIS — I10 Essential (primary) hypertension: Secondary | ICD-10-CM

## 2023-04-16 DIAGNOSIS — E782 Mixed hyperlipidemia: Secondary | ICD-10-CM

## 2023-04-16 DIAGNOSIS — R002 Palpitations: Secondary | ICD-10-CM

## 2023-04-16 DIAGNOSIS — E1165 Type 2 diabetes mellitus with hyperglycemia: Secondary | ICD-10-CM

## 2023-04-16 DIAGNOSIS — I517 Cardiomegaly: Secondary | ICD-10-CM

## 2023-04-16 NOTE — Telephone Encounter (Signed)
Noted and agreed, thanks

## 2023-04-16 NOTE — Progress Notes (Unsigned)
EP to read

## 2023-04-16 NOTE — Telephone Encounter (Signed)
Called [facility] office which order was sent for an update, was advised by Burnett Harry all PCP offices need to send order to [facility] office for monitors this is why the order hasn't been processed. Shelly corrected the order for patient and advised patient should receive the monitor in 2-3 business days.

## 2023-04-29 DIAGNOSIS — I517 Cardiomegaly: Secondary | ICD-10-CM

## 2023-04-29 DIAGNOSIS — R0600 Dyspnea, unspecified: Secondary | ICD-10-CM

## 2023-04-29 DIAGNOSIS — R002 Palpitations: Secondary | ICD-10-CM

## 2023-05-01 NOTE — Telephone Encounter (Signed)
Can you please see message from patient regarding still not being scheduled for this referral and advise next steps

## 2023-05-08 NOTE — Telephone Encounter (Signed)
Gwen, can you please have your team call Dr. Clarise Cruz office to follow up on this and then call the patient in regard with an update?

## 2023-05-19 ENCOUNTER — Other Ambulatory Visit: Payer: Self-pay | Admitting: Physician Assistant

## 2023-05-19 DIAGNOSIS — R002 Palpitations: Secondary | ICD-10-CM

## 2023-05-19 DIAGNOSIS — I10 Essential (primary) hypertension: Secondary | ICD-10-CM

## 2023-05-19 DIAGNOSIS — R0600 Dyspnea, unspecified: Secondary | ICD-10-CM

## 2023-05-19 DIAGNOSIS — I517 Cardiomegaly: Secondary | ICD-10-CM

## 2023-05-19 NOTE — Telephone Encounter (Signed)
Dedra Skeens is authorizing order.  States patient will hear from Lupita Leash at [facility] to get scheduled.  I have advised patient to call me back if she has not heard from [facility] by tomorrow afternoon.

## 2023-05-26 ENCOUNTER — Ambulatory Visit (HOSPITAL_BASED_OUTPATIENT_CLINIC_OR_DEPARTMENT_OTHER)
Admission: RE | Admit: 2023-05-26 | Discharge: 2023-05-26 | Disposition: A | Discharge status: Home / Self Care | Payer: BC Managed Care – PPO | Source: Ambulatory Visit | Attending: Physician Assistant | Admitting: Physician Assistant

## 2023-05-26 ENCOUNTER — Other Ambulatory Visit: Payer: Self-pay | Admitting: Physician Assistant

## 2023-05-26 DIAGNOSIS — I517 Cardiomegaly: Secondary | ICD-10-CM | POA: Diagnosis not present

## 2023-05-26 DIAGNOSIS — R0600 Dyspnea, unspecified: Secondary | ICD-10-CM

## 2023-05-26 DIAGNOSIS — R0609 Other forms of dyspnea: Secondary | ICD-10-CM

## 2023-05-26 DIAGNOSIS — I10 Essential (primary) hypertension: Secondary | ICD-10-CM

## 2023-05-26 DIAGNOSIS — R002 Palpitations: Secondary | ICD-10-CM

## 2023-05-26 LAB — ECHOCARDIOGRAM COMPLETE
AR max vel: 1.89 cm2
AV Area VTI: 2.01 cm2
AV Area mean vel: 1.97 cm2
AV Mean grad: 8 mm[Hg]
AV Peak grad: 13.2 mm[Hg]
Ao pk vel: 1.82 m/s
Area-P 1/2: 4.49 cm2
Calc EF: 63.3 %
MV M vel: 4.36 m/s
MV Peak grad: 76 mm[Hg]
S' Lateral: 2.4 cm
Single Plane A2C EF: 63.4 %
Single Plane A4C EF: 61.6 %

## 2023-05-28 LAB — HM MAMMOGRAPHY

## 2023-06-04 LAB — HM PAP SMEAR: HPV, high-risk: NEGATIVE

## 2023-07-02 ENCOUNTER — Other Ambulatory Visit: Payer: Self-pay | Admitting: Physician Assistant

## 2023-07-02 DIAGNOSIS — E782 Mixed hyperlipidemia: Secondary | ICD-10-CM

## 2023-07-02 DIAGNOSIS — I10 Essential (primary) hypertension: Secondary | ICD-10-CM

## 2023-07-02 DIAGNOSIS — E1165 Type 2 diabetes mellitus with hyperglycemia: Secondary | ICD-10-CM

## 2023-07-17 ENCOUNTER — Encounter: Payer: Self-pay | Admitting: Physician Assistant

## 2023-07-17 ENCOUNTER — Ambulatory Visit: Payer: 59 | Admitting: Physician Assistant

## 2023-07-17 VITALS — BP 116/78 | HR 91 | Temp 97.9°F | Ht 63.0 in | Wt 189.4 lb

## 2023-07-17 DIAGNOSIS — Z8249 Family history of ischemic heart disease and other diseases of the circulatory system: Secondary | ICD-10-CM

## 2023-07-17 DIAGNOSIS — I10 Essential (primary) hypertension: Secondary | ICD-10-CM | POA: Diagnosis not present

## 2023-07-17 DIAGNOSIS — M2550 Pain in unspecified joint: Secondary | ICD-10-CM

## 2023-07-17 DIAGNOSIS — E782 Mixed hyperlipidemia: Secondary | ICD-10-CM

## 2023-07-17 DIAGNOSIS — E890 Postprocedural hypothyroidism: Secondary | ICD-10-CM

## 2023-07-17 DIAGNOSIS — E1165 Type 2 diabetes mellitus with hyperglycemia: Secondary | ICD-10-CM | POA: Diagnosis not present

## 2023-07-17 DIAGNOSIS — Z7984 Long term (current) use of oral hypoglycemic drugs: Secondary | ICD-10-CM | POA: Diagnosis not present

## 2023-07-17 LAB — POCT GLYCOSYLATED HEMOGLOBIN (HGB A1C): Hemoglobin A1C: 6.5 % — AB (ref 4.0–5.6)

## 2023-07-17 MED ORDER — METFORMIN HCL ER 500 MG PO TB24: 500.0000 mg | ORAL_TABLET | Freq: Two times a day (BID) | ORAL | 1 refills | Status: DC

## 2023-07-17 NOTE — Progress Notes (Signed)
Patient ID: Paula Huffman, female    DOB: [birth year], 53 y.o.   MRN: [id number]   Assessment & Plan:  Family history of cardiovascular disease -     Ambulatory referral to Cardiology  Essential hypertension -     Ambulatory referral to Cardiology  Type 2 diabetes mellitus with hyperglycemia, without long-term current use of insulin (HCC) -     Ambulatory referral to Cardiology -     POCT glycosylated hemoglobin (Hb A1C)  Polyarthralgia  Postablative hypothyroidism  Mixed hyperlipidemia  Other orders -     metFORMIN HCl ER; Take 1 tablet (500 mg total) by mouth 2 (two) times daily with a meal.  Dispense: 180 tablet; Refill: 1   Assessment and Plan    Hypertension Stable on Amlodipine 5mg  and Spironolactone 25mg . Recent BP readings within normal range. -Continue current medications.  Joint Pain Recent evaluation by Rheumatologist with x-rays and blood work pending. No daily pain, but reports daily stiffness and intermittent pain in ankles, hips, and wrists. -Await results from Rheumatologist.  Hyperlipidemia -Continue Pravastatin. If muscle discomfort returns, consider dose adjustment or alternative medication.  Type 2 Diabetes Mellitus Improved A1C from 7.7 to 6.5 on Metformin 500mg  twice daily. -Continue Metformin 500mg  twice daily. -Encourage continued lifestyle modifications. -Check A1C in 4 months.  Cardiac Health History of palpitations and shortness of breath post-COVID. Recent echo and heart monitor normal. Family history of cardiac disease. -Refer to Cardiology for further risk stratification and potential cardiac calcium score.  Thyroid Disorder Stable on levothyroxine 137 mcg daily -Continue current medication.    Return in about 4 months (around 11/14/2023) for recheck/follow-up.    Subjective:    Chief Complaint  Patient presents with   Medical Management of Chronic Issues    Pt in office for 4 mon f/u; pt also asking next steps since  last test she had completed; Echo, heart monitor; and chest xray completed;    HPI Discussed the use of AI scribe software for clinical note transcription with the patient, who gave verbal consent to proceed.  History of Present Illness   The patient, with a history of hypertension, hyperlipidemia, and diabetes, presents for a follow-up visit. She reports joint pain and stiffness, which has been evaluated by a rheumatologist with x-rays and blood work. The patient notes that the pain is not constant, but the stiffness is daily. The pain is most noticeable in the hips and ankles.  The patient also reports muscle discomfort, described as "weird muscle feelings" in the front of the thigh, since starting pravastatin for hyperlipidemia. The discomfort was more frequent in October and early November, but has not been as noticeable recently.  The patient has also had a chest x-ray, sonogram, and heart monitor due to concerns about her heart. She reports that she has not been experiencing the skipped beats that she had previously reported. She does note that she gets winded easier, particularly when going up stairs, which started after having COVID-19.  The patient's blood pressure has been stable and her A1c has improved from 7.7 to 6.5 in the last three months. She is currently taking metformin 500 mg twice daily for diabetes and is working on lifestyle modifications.       Past Medical History:  Diagnosis Date   Anal fissure    Anemia    GERD (gastroesophageal reflux disease)    History of reactive airway disease    Hyperlipidemia    Hypertension    Thyroid  disease    HYPOTHYROID    Past Surgical History:  Procedure Laterality Date   APPENDECTOMY     AGE 24   BREAST SURGERY     LEFT BREAST EXCISION;CALCIFICATION REMOVED   CESAREAN SECTION     WISDOM TOOTH EXTRACTION      Family History  Problem Relation Age of Onset   Thyroid disease Mother    Hypertension Mother    Diabetes  Mother    Heart disease Father    Heart attack Father    Hypertension Father    Thyroid disease Maternal Grandmother    Hypertension Maternal Grandmother    Stroke Maternal Grandmother    Heart failure Maternal Grandmother    Hypertension Maternal Grandfather    Hypertension Paternal Grandfather    Heart attack Paternal Grandfather    Asthma Daughter    Thyroid disease Maternal Aunt    Heart failure Maternal Aunt    Hypertension Paternal Aunt    Heart attack Other     Social History   Tobacco Use   Smoking status: Never   Smokeless tobacco: Never  Substance Use Topics   Alcohol use: Yes   Drug use: No     Allergies  Allergen Reactions   Penicillins Other (See Comments) and Rash   Azithromycin Diarrhea, Nausea And Vomiting and Other (See Comments)   Doxycycline Other (See Comments)   Hydrocodone-Acetaminophen Other (See Comments)   Penicillin G Other (See Comments)   Vicodin [Hydrocodone-Acetaminophen]     Review of Systems NEGATIVE UNLESS OTHERWISE INDICATED IN HPI      Objective:     BP 116/78 (BP Location: Left Arm, Patient Position: Sitting)   Pulse 91   Temp 97.9 F (36.6 C) (Temporal)   Ht 5\' 3"  (1.6 m)   Wt 189 lb 6.4 oz (85.9 kg)   SpO2 96%   BMI 33.55 kg/m   Wt Readings from Last 3 Encounters:  07/17/23 189 lb 6.4 oz (85.9 kg)  03/28/23 198 lb (89.8 kg)  03/12/23 198 lb 12.8 oz (90.2 kg)    BP Readings from Last 3 Encounters:  07/17/23 116/78  03/12/23 (!) 147/78  09/12/22 (!) 149/78     Physical Exam Vitals and nursing note reviewed.  Constitutional:      Appearance: Normal appearance. She is normal weight. She is not toxic-appearing.  HENT:     Head: Normocephalic and atraumatic.     Right Ear: External ear normal.     Left Ear: External ear normal.     Mouth/Throat:     Mouth: Mucous membranes are moist.  Eyes:     Extraocular Movements: Extraocular movements intact.     Conjunctiva/sclera: Conjunctivae normal.     Pupils:  Pupils are equal, round, and reactive to light.  Cardiovascular:     Rate and Rhythm: Normal rate and regular rhythm.     Pulses: Normal pulses.     Heart sounds: Normal heart sounds.  Pulmonary:     Effort: Pulmonary effort is normal.     Breath sounds: No wheezing.  Musculoskeletal:        General: No swelling, tenderness or deformity. Normal range of motion.     Cervical back: Normal range of motion and neck supple.  Skin:    General: Skin is warm and dry.  Neurological:     General: No focal deficit present.     Mental Status: She is alert and oriented to person, place, and time.  Psychiatric:  Mood and Affect: Mood normal.        Behavior: Behavior normal.       Laquanta Hummel M Tramond Slinker, PA-C

## 2023-07-21 ENCOUNTER — Encounter: Payer: Self-pay | Admitting: Physician Assistant

## 2023-07-22 LAB — HM DIABETES EYE EXAM

## 2023-09-01 ENCOUNTER — Ambulatory Visit (HOSPITAL_BASED_OUTPATIENT_CLINIC_OR_DEPARTMENT_OTHER): Payer: 59 | Admitting: Cardiovascular Disease

## 2023-09-01 ENCOUNTER — Encounter (HOSPITAL_BASED_OUTPATIENT_CLINIC_OR_DEPARTMENT_OTHER): Payer: Self-pay | Admitting: Cardiovascular Disease

## 2023-09-01 VITALS — BP 146/78 | HR 76 | Ht 63.0 in | Wt 192.4 lb

## 2023-09-01 DIAGNOSIS — R079 Chest pain, unspecified: Secondary | ICD-10-CM

## 2023-09-01 DIAGNOSIS — I10 Essential (primary) hypertension: Secondary | ICD-10-CM | POA: Diagnosis not present

## 2023-09-01 DIAGNOSIS — E782 Mixed hyperlipidemia: Secondary | ICD-10-CM

## 2023-09-01 MED ORDER — METOPROLOL TARTRATE 100 MG PO TABS: ORAL_TABLET | ORAL | 0 refills | Status: DC

## 2023-09-01 NOTE — Progress Notes (Unsigned)
 Cardiology Office Note:  .   Date:  09/03/2023  ID:  [name], DOB 1970-09-07, MRN [id number] PCP: Allwardt, Crist Infante, PA-C  [facility] Providers Cardiologist:  None    History of Present Illness: .    Paula Huffman is a 53 y.o. female with hypertension , hyperlipidemia, PACs/PVCs, and diabetes who is being seen today for the evaluation of cardiovascular risk at the request of Allwardt, Alyssa M, PA-C.  Paula Huffman has noted palpitations and shortness of breath after a COVID infection.  She had an echo 05/2023 that revealed LVEF 55-60% and normal diastolic function.  She also wore an ambulatory monitor that revealed occasional PACs and PVCs but no significant arrhythmias.  Paula Huffman presents with exertional dyspnea and a history of an enlarged heart on chest x-ray. She was referred by her primary care physician for evaluation of an enlarged heart noted on a chest x-ray.  She experiences exertional dyspnea characterized by a sensation of wheezing and mild shortness of breath after climbing five steps at home, occurring 30 to 60 seconds after reaching the top. This symptom began after she had COVID-19 in late 2020. She does not experience these symptoms during regular exercise, such as treadmill workouts or weight lifting, which she performs one to three times a week. No swelling in her legs or feet and no shortness of breath while lying down.  A chest x-ray performed in the fall showed an enlarged heart, prompting further evaluation with an echocardiogram and heart monitor, both of which returned normal results. She has a history of high cholesterol and high blood pressure, for which she is currently taking amlodipine, spironolactone, and pravastatin. Amlodipine has been used for a longer period, while spironolactone and pravastatin were started in the fall and January, respectively. She has not been regularly checking her blood pressure at home recently.  She has a  past medical history of Graves' disease, diagnosed in the late 1990s, which was associated with palpitations at that time. She occasionally experiences sensations of skipped or delayed heartbeats, which occur sporadically and are not associated with caffeine intake, as she primarily consumes herbal tea and a small amount of dark chocolate.  Her family history is significant for heart disease, including her father who had a heart attack at around 63 years old and is under cardiology care, her paternal grandfather who underwent bypass surgery, her paternal great-uncle who had a heart attack, and her maternal grandmother who had congestive heart failure.  Her social history includes no smoking and minimal alcohol consumption, with occasional intake of one to two drinks per week. She tries to limit salt and carbohydrates in her diet, cooking at home and ordering out in a balanced manner.     ROS:  As per HPI  Studies Reviewed: Marland Kitchen   EKG Interpretation Date/Time:  Monday September 01 2023 15:59:21 EST Ventricular Rate:  71 PR Interval:  138 QRS Duration:  80 QT Interval:  374 QTC Calculation: 406 R Axis:   33  Text Interpretation: Normal sinus rhythm Normal ECG Confirmed by Chilton Si ([id number]) on 09/01/2023 4:16:41 PM   Echo 05/26/23:    1. Left ventricular ejection fraction, by estimation, is 55 to 60%. The  left ventricle has normal function. The left ventricle has no regional  wall motion abnormalities. Left ventricular diastolic parameters were  normal.   2. Right ventricular systolic function is normal. The right ventricular  size is normal. There is normal pulmonary  artery systolic pressure.   3. The mitral valve is grossly normal. Mild mitral valve regurgitation.  No evidence of mitral stenosis.   4. The aortic valve is tricuspid. There is mild thickening of the aortic  valve. Aortic valve regurgitation is not visualized. No aortic stenosis is  present.   5. The inferior vena cava  is normal in size with greater than 50%  respiratory variability, suggesting right atrial pressure of 3 mmHg.   Zio Monitor 05/2023: 13 Day Monitor Sinus rhythm HR 53-138, avg 84 bpm Rare ectopy No arrhythmia detected   Patient-triggered events correlated with sinus rhythm and occassionally with isolated PVCs.  Risk Assessment/Calculations:         Physical Exam:   VS:  BP (!) 146/78   Pulse 76   Ht 5\' 3"  (1.6 m)   Wt 192 lb 6.4 oz (87.3 kg)   SpO2 98%   BMI 34.08 kg/m  , BMI Body mass index is 34.08 kg/m. GENERAL:  Well appearing HEENT: Pupils equal round and reactive, fundi not visualized, oral mucosa unremarkable NECK:  No jugular venous distention, waveform within normal limits, carotid upstroke brisk and symmetric, no bruits, no thyromegaly LUNGS:  Clear to auscultation bilaterally HEART:  RRR.  PMI not displaced or sustained,S1 and S2 within normal limits, no S3, no S4, no clicks, no rubs, no murmurs ABD:  Flat, positive bowel sounds normal in frequency in pitch, no bruits, no rebound, no guarding, no midline pulsatile mass, no hepatomegaly, no splenomegaly EXT:  2 plus pulses throughout, no edema, no cyanosis no clubbing SKIN:  No rashes no nodules NEURO:  Cranial nerves II through XII grossly intact, motor grossly intact throughout PSYCH:  Cognitively intact, oriented to person place and time   ASSESSMENT AND PLAN: .    # Dyspnea on Exertion Noted post-exertion dyspnea after climbing stairs, which started after COVID-19 infection. No dyspnea during regular exercise or at rest. No associated palpitations or chest pain. Echocardiogram and heart monitor results were normal. -Order Coronary CT-A to rule out coronary artery disease. -Check LP(a) levels for additional cardiovascular risk assessment.  # Hypertension On Amlodipine and Spironolactone. Blood pressure measured in office was elevated. -Continue current medications. -Check blood pressure at home twice daily  and record readings.  # Hyperlipidemia On Pravastatin. Recent cholesterol levels unknown. -Continue current medication. -Decision on medication adjustment pending Coronary CT results.  # Premature Ventricular Contractions (PVCs) and Premature Atrial Contractions (PACs) Noted on heart monitor. No associated symptoms or distress. -No change in management at this time.  Follow-up in approximately 2 months after Coronary CT and LP(a) results.       Signed, Chilton Si, MD

## 2023-09-01 NOTE — Patient Instructions (Addendum)
 Medication Instructions:  TAKE METOPROLOL 2 HOURS PRIOR TO CT   *If you need a refill on your cardiac medications before your next appointment, please call your pharmacy*  Lab Work: BMET/LPa 1 WEEK PRIOR TO CT  If you have labs (blood work) drawn today and your tests are completely normal, you will receive your results only by: MyChart Message (if you have MyChart) OR A paper copy in the mail If you have any lab test that is abnormal or we need to change your treatment, we will call you to review the results.  Testing/Procedures: Your physician has requested that you have cardiac CT. Cardiac computed tomography (CT) is a painless test that uses an x-ray machine to take clear, detailed pictures of your heart. For further information please visit [email or web address] Please follow instruction sheet as given.  Follow-Up: At [facility], you and your health needs are our priority.  As part of our continuing mission to provide you with exceptional heart care, we have created designated Provider Care Teams.  These Care Teams include your primary Cardiologist (physician) and Advanced Practice Providers (APPs -  Physician Assistants and Nurse Practitioners) who all work together to provide you with the care you need, when you need it.  We recommend signing up for the patient portal called "MyChart".  Sign up information is provided on this After Visit Summary.  MyChart is used to connect with patients for Virtual Visits (Telemedicine).  Patients are able to view lab/test results, encounter notes, upcoming appointments, etc.  Non-urgent messages can be sent to your provider as well.   To learn more about what you can do with MyChart, go to [email or web address].    Your next appointment:   2 TO 3 month(s)  Provider:   Chilton Si, MD, Eligha Bridegroom, NP, or Gillian Shields, NP     Other Instructions   Your cardiac CT will be scheduled at one of the below locations:   [facility] [address] [place], [place] [address] [phone]  OR  [facility] [address] [place], [place] [address] [phone]  OR   [facility] [address] [place], [place] [address] [phone]  OR   [facility] [address] [place], [place] [address] [phone]  If scheduled at [facility], please arrive at the [facility] and Children's Entrance (Entrance C2) of [facility] 30 minutes prior to test start time. You can use the FREE valet parking offered at entrance C (encouraged to control the heart rate for the test)  Proceed to the [facility] Radiology Department (first floor) to check-in and test prep.  All radiology patients and guests should use entrance C2 at [facility], accessed from [facility], even though the hospital's physical address listed is [address]    If scheduled at [facility] or [facility], please arrive 15 mins early for check-in and test prep.  There is spacious parking and easy access to the radiology department from the [facility] Vascular entrance. Please enter here and check-in with the desk attendant.   If scheduled at [facility], please arrive 30 minutes early for check-in and test prep.  Please follow these instructions carefully (unless otherwise directed):  An IV will be required for this test and Nitroglycerin will be given.  Hold all erectile dysfunction medications at least 3 days (72 hrs) prior  to test. (Ie viagra, cialis, sildenafil, tadalafil, etc)   On the Night Before the Test: Be sure to Drink plenty of water. Do not consume any caffeinated/decaffeinated beverages or chocolate 12 hours prior to your test. Do not take any antihistamines 12 hours prior to your test.  On the Day of the Test: Drink plenty of  water until 1 hour prior to the test. Do not eat any food 1 hour prior to test. You may take your regular medications prior to the test.  Take metoprolol (Lopressor) two hours prior to test. If you take Furosemide/Hydrochlorothiazide/Spironolactone/Chlorthalidone, please HOLD on the morning of the test. Patients who wear a continuous glucose monitor MUST remove the device prior to scanning. FEMALES- please wear underwire-free bra if available, avoid dresses & tight clothing      After the Test: Drink plenty of water. After receiving IV contrast, you may experience a mild flushed feeling. This is normal. On occasion, you may experience a mild rash up to 24 hours after the test. This is not dangerous. If this occurs, you can take Benadryl 25 mg, Zyrtec, Claritin, or Allegra and increase your fluid intake. (Patients taking Tikosyn should avoid Benadryl, and may take Zyrtec, Claritin, or Allegra) If you experience trouble breathing, this can be serious. If it is severe call 911 IMMEDIATELY. If it is mild, please call our office.  We will call to schedule your test 2-4 weeks out understanding that some insurance companies will need an authorization prior to the service being performed.   For more information and frequently asked questions, please visit our website : [email or web address]  For non-scheduling related questions, please contact the cardiac imaging nurse navigator should you have any questions/concerns: Cardiac Imaging Nurse Navigators Direct Office Dial: [phone]   For scheduling needs, including cancellations and rescheduling, please call [place], [phone].  Cardiac CT Angiogram A cardiac CT angiogram is a procedure to look at the heart and the area around the heart. It may be done to help find the cause of chest pains or other symptoms of heart disease. During this procedure, a substance called contrast dye is injected into a vein in the arm. The contrast  highlights the blood vessels in the area to be checked. A large X-ray machine (CT scanner), then takes detailed pictures of the heart and the surrounding area. The procedure is also sometimes called a coronary CT angiogram, coronary artery scanning, or CTA. A cardiac CT angiogram allows the health care provider to see how well blood is flowing to and from the heart. The provider will be able to see if there are any problems, such as: Blockage or narrowing of the arteries in the heart. Fluid around the heart. Signs of weakness or disease in the muscles, valves, and tissues of the heart. Tell a health care provider about: Any allergies you have. This is especially important if you have had a previous allergic reaction to medicines, contrast dye, or iodine. All medicines you are taking, including vitamins, herbs, eye drops, creams, and over-the-counter medicines. Any bleeding problems you have. Any surgeries you have had. Any medical conditions you have, including kidney problems or kidney failure. Whether you are pregnant or may be pregnant. Any anxiety disorders, chronic pain, or other conditions you have. These may increase your stress or prevent you from lying still. Any history of abnormal heart rhythms or heart procedures. What are the risks? Your provider will talk with you about risks. These may include: Bleeding. Infection. Allergic reactions to medicines or dyes.  Damage to other structures or organs. Kidney damage from the contrast dye. Increased risk of cancer from radiation exposure. This risk is low. Talk with your provider about: The risks and benefits of testing. How you can receive the lowest dose of radiation. What happens before the procedure? Wear comfortable clothing and remove any jewelry, glasses, dentures, and hearing aids. Follow instructions from your provider about eating and drinking. These may include: 12 hours before the procedure Avoid caffeine. This includes  tea, coffee, soda, energy drinks, and diet pills. Drink plenty of water or other fluids that do not have caffeine in them. Being well hydrated can prevent complications. 4-6 hours before the procedure Stop eating and drinking. This will reduce the risk of nausea from the contrast dye. Ask your provider about changing or stopping your regular medicines. These include: Diabetes medicines. Medicines to treat problems with erections (erectile dysfunction). If you have kidney problems, you may need to receive IV hydration before and after the test. What happens during the procedure?  Hair on your chest may need to be removed so that small sticky patches called electrodes can be placed on your chest. These will transmit information that helps to monitor your heart during the procedure. An IV will be inserted into one of your veins. You might be given a medicine to control your heart rate during the procedure. This will help to ensure that good images are obtained. You will be asked to lie on an exam table. This table will slide in and out of the CT machine during the procedure. Contrast dye will be injected into the IV. You might feel warm, or you may get a metallic taste in your mouth. You may be given medicines to relax or dilate the arteries in your heart. If you are allergic to contrast dyes or iodine you may be given medicine before the test to reduce the risk of an allergic reaction. The table that you are lying on will move into the CT machine tunnel for the scan. The person running the machine will give you instructions while the scans are being done. You may be asked to: Keep your arms above your head. Hold your breath for short periods. Stay very still, even if the table is moving. The procedure may vary among providers and hospitals. What can I expect after the procedure? After your procedure, it is common to have: A metallic taste in your mouth from the contrast dye. A feeling of  warmth. A headache from the heart medicine. Follow these instructions at home: Take over-the-counter and prescription medicines only as told by your provider. If you are told, drink enough fluid to keep your pee pale yellow. This will help to flush the contrast dye out of your body. Most people can return to their normal activities right after the procedure. Ask your provider what activities are safe for you. It is up to you to get the results of your procedure. Ask your provider, or the department that is doing the procedure, when your results will be ready. Contact a health care provider if: You have any symptoms of allergy to the contrast dye. These include: Shortness of breath. Rash or hives. A racing heartbeat. You notice a change in your peeing (urination). This information is not intended to replace advice given to you by your health care provider. Make sure you discuss any questions you have with your health care provider. Document Revised: 01/18/2022 Document Reviewed: 01/18/2022 Elsevier Patient Education  2024 [employer].

## 2023-09-03 ENCOUNTER — Encounter (HOSPITAL_BASED_OUTPATIENT_CLINIC_OR_DEPARTMENT_OTHER): Payer: Self-pay | Admitting: Cardiovascular Disease

## 2023-09-17 LAB — BASIC METABOLIC PANEL
BUN/Creatinine Ratio: 16 (ref 9–23)
BUN: 13 mg/dL (ref 6–24)
CO2: 24 mmol/L (ref 20–29)
Calcium: 9.4 mg/dL (ref 8.7–10.2)
Chloride: 100 mmol/L (ref 96–106)
Creatinine, Ser: 0.8 mg/dL (ref 0.57–1.00)
Glucose: 93 mg/dL (ref 70–99)
Potassium: 3.6 mmol/L (ref 3.5–5.2)
Sodium: 140 mmol/L (ref 134–144)
eGFR: 88 mL/min/{1.73_m2} (ref >59)

## 2023-09-17 LAB — LIPOPROTEIN A (LPA): Lipoprotein (a): 258.9 nmol/L — ABNORMAL HIGH (ref <75.0)

## 2023-09-19 ENCOUNTER — Encounter (HOSPITAL_COMMUNITY): Payer: Self-pay

## 2023-09-22 ENCOUNTER — Encounter (HOSPITAL_BASED_OUTPATIENT_CLINIC_OR_DEPARTMENT_OTHER): Payer: Self-pay | Admitting: Cardiovascular Disease

## 2023-09-23 ENCOUNTER — Ambulatory Visit (HOSPITAL_COMMUNITY)
Admission: RE | Admit: 2023-09-23 | Discharge: 2023-09-23 | Disposition: A | Discharge status: Home / Self Care | Source: Ambulatory Visit | Attending: Cardiovascular Disease | Admitting: Cardiovascular Disease

## 2023-09-23 DIAGNOSIS — R079 Chest pain, unspecified: Secondary | ICD-10-CM | POA: Diagnosis not present

## 2023-09-23 DIAGNOSIS — E782 Mixed hyperlipidemia: Secondary | ICD-10-CM | POA: Insufficient documentation

## 2023-09-23 DIAGNOSIS — I10 Essential (primary) hypertension: Secondary | ICD-10-CM | POA: Insufficient documentation

## 2023-09-23 MED ORDER — NITROGLYCERIN 0.4 MG SL SUBL
0.8000 mg | SUBLINGUAL_TABLET | Freq: Once | SUBLINGUAL | Status: AC
  Administered 2023-09-23: 0.8 mg via SUBLINGUAL

## 2023-09-23 MED ORDER — NITROGLYCERIN 0.4 MG SL SUBL
SUBLINGUAL_TABLET | SUBLINGUAL | Status: AC
  Filled 2023-09-23: qty 2

## 2023-09-23 MED ORDER — DILTIAZEM HCL 25 MG/5ML IV SOLN: 10.0000 mg | INTRAVENOUS | Status: DC | PRN

## 2023-09-23 MED ORDER — METOPROLOL TARTRATE 5 MG/5ML IV SOLN: 10.0000 mg | Freq: Once | INTRAVENOUS | Status: DC | PRN

## 2023-09-23 MED ORDER — IOHEXOL 350 MG/ML SOLN
100.0000 mL | Freq: Once | INTRAVENOUS | Status: AC | PRN
  Administered 2023-09-23: 100 mL via INTRAVENOUS

## 2023-09-24 ENCOUNTER — Encounter (HOSPITAL_BASED_OUTPATIENT_CLINIC_OR_DEPARTMENT_OTHER): Payer: Self-pay | Admitting: Cardiovascular Disease

## 2023-10-14 ENCOUNTER — Other Ambulatory Visit: Payer: Self-pay | Admitting: Physician Assistant

## 2023-10-14 DIAGNOSIS — I10 Essential (primary) hypertension: Secondary | ICD-10-CM

## 2023-10-27 ENCOUNTER — Ambulatory Visit
Admission: EM | Admit: 2023-10-27 | Discharge: 2023-10-27 | Disposition: A | Discharge status: Home / Self Care | Attending: Nurse Practitioner | Admitting: Nurse Practitioner

## 2023-10-27 ENCOUNTER — Telehealth: Payer: Self-pay | Admitting: Nurse Practitioner

## 2023-10-27 ENCOUNTER — Other Ambulatory Visit: Payer: Self-pay

## 2023-10-27 ENCOUNTER — Ambulatory Visit (HOSPITAL_COMMUNITY)
Admit: 2023-10-27 | Discharge: 2023-10-27 | Disposition: A | Discharge status: Home / Self Care | Attending: Surgery | Admitting: Surgery

## 2023-10-27 ENCOUNTER — Other Ambulatory Visit (HOSPITAL_COMMUNITY)
Admission: RE | Admit: 2023-10-27 | Discharge: 2023-10-27 | Disposition: A | Discharge status: Home / Self Care | Source: Home / Self Care | Attending: Surgery | Admitting: Surgery

## 2023-10-27 DIAGNOSIS — M7989 Other specified soft tissue disorders: Secondary | ICD-10-CM | POA: Diagnosis not present

## 2023-10-27 DIAGNOSIS — R233 Spontaneous ecchymoses: Secondary | ICD-10-CM | POA: Insufficient documentation

## 2023-10-27 LAB — CBC WITH DIFFERENTIAL/PLATELET
Abs Immature Granulocytes: 0.01 10*3/uL (ref 0.00–0.07)
Basophils Absolute: 0.1 10*3/uL (ref 0.0–0.1)
Basophils Relative: 1 %
Eosinophils Absolute: 0.2 10*3/uL (ref 0.0–0.5)
Eosinophils Relative: 2 %
HCT: 42 % (ref 36.0–46.0)
Hemoglobin: 13.9 g/dL (ref 12.0–15.0)
Immature Granulocytes: 0 %
Lymphocytes Relative: 40 %
Lymphs Abs: 2.6 10*3/uL (ref 0.7–4.0)
MCH: 26.2 pg (ref 26.0–34.0)
MCHC: 33.1 g/dL (ref 30.0–36.0)
MCV: 79.1 fL — ABNORMAL LOW (ref 80.0–100.0)
Monocytes Absolute: 0.6 10*3/uL (ref 0.1–1.0)
Monocytes Relative: 9 %
Neutro Abs: 3.1 10*3/uL (ref 1.7–7.7)
Neutrophils Relative %: 48 %
Platelets: 329 10*3/uL (ref 150–400)
RBC: 5.31 MIL/uL — ABNORMAL HIGH (ref 3.87–5.11)
RDW: 14.3 % (ref 11.5–15.5)
WBC: 6.4 10*3/uL (ref 4.0–10.5)
nRBC: 0 % (ref 0.0–0.2)

## 2023-10-27 LAB — FIBRINOGEN: Fibrinogen: 508 mg/dL — ABNORMAL HIGH (ref 210–475)

## 2023-10-27 LAB — BASIC METABOLIC PANEL WITH GFR
Anion gap: 10 (ref 5–15)
BUN: 14 mg/dL (ref 6–20)
CO2: 24 mmol/L (ref 22–32)
Calcium: 9.4 mg/dL (ref 8.9–10.3)
Chloride: 104 mmol/L (ref 98–111)
Creatinine, Ser: 0.87 mg/dL (ref 0.44–1.00)
GFR, Estimated: >60 mL/min (ref >60)
Glucose, Bld: 120 mg/dL — ABNORMAL HIGH (ref 70–99)
Potassium: 3.5 mmol/L (ref 3.5–5.1)
Sodium: 138 mmol/L (ref 135–145)

## 2023-10-27 LAB — HEPATIC FUNCTION PANEL
ALT: 20 U/L (ref 0–44)
AST: 18 U/L (ref 15–41)
Albumin: 4 g/dL (ref 3.5–5.0)
Alkaline Phosphatase: 69 U/L (ref 38–126)
Bilirubin, Direct: <0.1 mg/dL (ref 0.0–0.2)
Total Bilirubin: 0.6 mg/dL (ref 0.0–1.2)
Total Protein: 7.9 g/dL (ref 6.5–8.1)

## 2023-10-27 LAB — APTT: aPTT: 35 s (ref 24–36)

## 2023-10-27 LAB — TSH: TSH: 0.125 u[IU]/mL — ABNORMAL LOW (ref 0.350–4.500)

## 2023-10-27 LAB — PROTIME-INR
INR: 1 (ref 0.8–1.2)
Prothrombin Time: 13.5 s (ref 11.4–15.2)

## 2023-10-27 NOTE — ED Provider Notes (Signed)
 [facility]    CSN: [id number] Arrival date & time: 10/27/23  1017      History   Chief Complaint No chief complaint on file.   HPI Paula Huffman is a 53 y.o. female.   Paula Huffman is a 53 year old female who presents with bruising and tenderness of the right upper arm. Approximately two weeks ago, she noticed soreness in the right bicep area while working out at the gym. She reports that the discomfort started while performing alternating arm motions during treadmill walking. She denies any weightlifting activities at that time. The tenderness gradually progressed and extended down the inner arm to the wrist area. The pain is present only with touch and there is no pain associated with arm movement. About four days ago, she began to notice bruising on the inner upper arm and at the bend of the inner elbow. She denies any injury. Mild swelling has also been present. She denies fever, body aches, or chills.  The patient's past medical history is notable for osteoarthritis of the hips, knees, ankles, and wrists, diagnosed in 2024 after evaluation by rheumatology. At that time, blood work showed an elevated ANA titer and ESR with a negative rheumatoid factor. Lupus and rheumatoid arthritis were ruled out following additional imaging and lab studies. She has not required orthopedic care and does not take daily medication specifically for osteoarthritis.  Regarding her medication history, she has been on aspirin 81 mg three times weekly for the past several years. She is not on any blood thinners and has no history of bleeding disorders. She reports no prior episodes of spontaneous bruising. She is not taking any new medications.  Personal and family history is negative for cancer. She is an only child and denies any family history of malignancy. She has no history of gastrointestinal bleeding, including black or bloody stools. She does report a history of a calcification  removal from the left breast in the late 1990s and an episode of nipple discharge in August 2020. Imaging at that time showed dense breast tissue; biopsy results were benign. She developed a large hematoma post-biopsy, which resolved spontaneously without further issues. Her mammograms have remained normal, with the most recent completed in November 2024.  The patient denies gum bleeding during teeth brushing and has had no other postoperative bleeding issues except for the hematoma in 2020. She does not routinely use NSAIDs and generally uses Tylenol as needed for pain relief.  The following portions of the patient's history were reviewed and updated as appropriate: allergies, current medications, past family history, past medical history, past social history, past surgical history, and problem list.      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 Authorizing Provider  albuterol  (VENTOLIN  HFA) 108 (90 Base) MCG/ACT inhaler Inhale 1-2 puffs  into the lungs every 6 (six) hours as needed for wheezing or shortness of breath. 07/01/22   Adolph Hoop, PA-C  amLODipine  (NORVASC ) 5 MG tablet Take 1 tablet (5 mg total) by mouth daily. 03/12/23   Allwardt, Alyssa M, PA-C  aspirin 81 MG chewable tablet Chew 81 mg by mouth as needed. 3 times weekly    [provider]  fluticasone (FLONASE) 50 MCG/ACT nasal spray Place 1 spray into both nostrils as needed. PRN    [provider]  glucose blood test strip Patient to check glucose levels one time daily 03/12/23   Allwardt, Alyssa M, PA-C  levothyroxine  (SYNTHROID ) 137 MCG tablet Take 1 tablet (137 mcg total) by mouth daily before breakfast. 03/12/23 06/10/23  Allwardt, Alyssa M, PA-C  metFORMIN  (GLUCOPHAGE -XR) 500 MG 24 hr tablet Take 1 tablet (500 mg total) by mouth 2 (two) times daily with a meal. 07/17/23 10/15/23  Allwardt, Deleta Felix, PA-C  Multiple Vitamins-Minerals (MULTIVITAMIN ADULT, MINERALS, PO)     [provider]  norethindrone  (MICRONOR ,CAMILA ,ERRIN ) 0.35 MG tablet Take 1 tablet (0.35 mg total) by mouth daily. 04/28/12   Ivin Marrow, PA-C  pravastatin  (PRAVACHOL ) 40 MG tablet TAKE 1 TABLET BY MOUTH EVERY OTHER EVENING X 4 WEEKS, THEN EVERY EVENING IF TOLERATING WELL 07/03/23   Allwardt, Alyssa M, PA-C  spironolactone  (ALDACTONE ) 25 MG tablet TAKE 1 TABLET (25 MG TOTAL) BY MOUTH DAILY. 10/14/23 11/13/23  Allwardt, Deleta Felix, PA-C    Family History Family History  Problem Relation Age of Onset   Thyroid disease Mother    Hypertension Mother    Diabetes Mother    Heart attack Father 33   Heart disease Father    Hypertension Father    Thyroid disease Maternal Aunt    Heart failure Maternal Aunt    Hypertension Paternal Aunt    Heart attack Paternal Uncle    Thyroid disease Maternal Grandmother    Hypertension Maternal Grandmother    Stroke Maternal Grandmother    Heart failure Maternal Grandmother    Hypertension Maternal Grandfather    Coronary artery disease  Paternal Grandmother    Hypertension Paternal Grandfather    Heart attack Paternal Grandfather    Asthma Daughter    Heart attack Other     Social History Social History   Tobacco Use   Smoking status: Never   Smokeless tobacco: Never  Substance Use Topics   Alcohol use: Yes    Comment: occ   Drug use: No     Allergies   Penicillins, Azithromycin, Doxycycline, Hydrocodone-acetaminophen, Penicillin g, and Vicodin [hydrocodone-acetaminophen]   Review of Systems Review of Systems  Constitutional:  Negative for fatigue and fever.  Respiratory:  Negative for shortness of breath.   Cardiovascular:  Negative for chest pain and palpitations.  Gastrointestinal:  Negative for anal bleeding, blood  in stool and rectal pain.  Genitourinary:  Negative for vaginal bleeding.  Musculoskeletal:  Positive for arthralgias. Negative for gait problem, joint swelling and myalgias.  Neurological:  Negative for weakness and numbness.  Hematological:  Bruises/bleeds easily.  All other systems reviewed and are negative.    Physical Exam Triage Vital Signs ED Triage Vitals  Encounter Vitals Group     BP 10/27/23 1032 130/82     Systolic BP Percentile --      Diastolic BP Percentile --      Pulse Rate 10/27/23 1032 90     Resp 10/27/23 1032 17     Temp 10/27/23 1032 98.9 F (37.2 C)     Temp Source 10/27/23 1032 Oral     SpO2 10/27/23 1032 95 %     Weight --      Height --      Head Circumference --      Peak Flow --      Pain Score 10/27/23 1028 2     Pain Loc --      Pain Education --      Exclude from Growth Chart --    No data found.  Updated Vital Signs BP 130/82   Pulse 90   Temp 98.9 F (37.2 C) (Oral)   Resp 17   LMP 08/05/2023   SpO2 95%   Visual Acuity Right Eye Distance:   Left Eye Distance:   Bilateral Distance:    Right Eye Near:   Left Eye Near:    Bilateral Near:     Physical Exam Vitals reviewed.  Constitutional:      General: She is awake. She is  not in acute distress.    Appearance: Normal appearance. She is well-developed and well-groomed. She is obese. She is not ill-appearing, toxic-appearing or diaphoretic.  HENT:     Head: Normocephalic.     Mouth/Throat:     Mouth: Mucous membranes are moist.  Eyes:     General: Vision grossly intact.     Conjunctiva/sclera: Conjunctivae normal.     Pupils: Pupils are equal, round, and reactive to light.  Cardiovascular:     Rate and Rhythm: Normal rate and regular rhythm.     Heart sounds: Normal heart sounds.  Pulmonary:     Effort: Pulmonary effort is normal.     Breath sounds: Normal breath sounds and air entry.  Abdominal:     Palpations: Abdomen is soft.  Musculoskeletal:        General: Normal range of motion.     Cervical back: Full passive range of motion without pain, normal range of motion and neck supple.  Lymphadenopathy:     Cervical: No cervical adenopathy.     Upper Body:     Right upper body: No axillary adenopathy.  Skin:    General: Skin is warm and dry.     Findings: Bruising present.     Comments: Bruising of various stages noted along the inner aspect of the right upper arm extending to the antecubital fossa region. The bruising appears as areas of yellow-green discoloration consistent with resolving ecchymosis. Mild swelling is present. No associated warmth, fluctuance, or open skin. No signs of active bleeding or infection.   Neurological:     General: No focal deficit present.     Mental Status: She is alert and oriented to person, place, and time.     Sensory: Sensation is intact. No sensory deficit.     Motor: Motor function  is intact. No weakness.     Coordination: Coordination is intact.     Gait: Gait is intact.  Psychiatric:        Behavior: Behavior is cooperative.           UC Treatments / Results  Labs (all labs ordered are listed, but only abnormal results are displayed) Labs Reviewed - No data to display  EKG   Radiology No  results found.  Procedures Procedures (including critical care time)  Medications Ordered in UC Medications - No data to display  Initial Impression / Assessment and Plan / UC Course  I have reviewed the triage vital signs and the nursing notes.  Pertinent labs & imaging results that were available during my care of the patient were reviewed by me and considered in my medical decision making (see chart for details).     53 yo female presenting with spontaneous ecchymosis to the right upper extremity without clear history of trauma or injury. The etiology of the bruising is currently unclear. An ultrasound of the right upper extremity has been ordered to evaluate for possible underlying vascular abnormalities or soft tissue injury. Blood work has also been ordered, including CBC, BMP, hepatic function panel, TSH with T4, Vitamin K levels, fibrinogen, PTT, and PT-INR to assess for hematologic, metabolic, and coagulation abnormalities. The patient has been advised to discontinue aspirin therapy and to avoid NSAID use at this time. She was also instructed to avoid blood pressure measurements and venipuncture sticks to the right upper extremity. Further recommendations will be based on the results of the pending imaging and laboratory studies.  Today's evaluation has revealed no signs of a dangerous process. Discussed diagnosis with patient and/or guardian. Patient and/or guardian aware of their diagnosis, possible red flag symptoms to watch out for and need for close follow up. Patient and/or guardian understands verbal and written discharge instructions. Patient and/or guardian comfortable with plan and disposition.  Patient and/or guardian has a clear mental status at this time, good insight into illness (after discussion and teaching) and has clear judgment to make decisions regarding their care  Documentation was completed with the aid of voice recognition software. Transcription may contain  typographical errors. Final Clinical Impressions(s) / UC Diagnoses   Final diagnoses:  Spontaneous ecchymosis     Discharge Instructions      You were seen today for unexplained bruising to your right arm. An ultrasound of your right arm will be done to check for any underlying issues, and blood work has been ordered to help determine the cause. Stop your aspirin for now. Also do not take aleve, ibuprofen, or any other NSAID medications, as these can increase bleeding and bruising. Avoid having your blood pressure taken or any blood draws or injections done in the affected arm until further notice.   Your ultrasound is scheduled for today at 1:00 PM at [address] in the Vascular Department on the 4th floor. Please arrive by 12:45 PM. If you have any questions, you can contact them at [phone].  Blood work will need to be completed at [facility]. Please go to the 1st floor outpatient lab. If you have any questions regarding lab services, you can reach them at [phone].  You will be notified of the results of your imaging and blood work once available and further recommendations will be based on the findings.        ED Prescriptions   None    PDMP not reviewed this  encounter.   Maryruth Sol, [place] 10/27/23 1244

## 2023-10-27 NOTE — Telephone Encounter (Signed)
-----   Message from [facility] sent at 10/27/2023  1:36 PM EDT ----- Preliminary report UE venous  Jannely Camm  January 21, 2071.

## 2023-10-27 NOTE — Discharge Instructions (Addendum)
 You were seen today for unexplained bruising to your right arm. An ultrasound of your right arm will be done to check for any underlying issues, and blood work has been ordered to help determine the cause. Stop your aspirin for now. Also do not take aleve, ibuprofen, or any other NSAID medications, as these can increase bleeding and bruising. Avoid having your blood pressure taken or any blood draws or injections done in the affected arm until further notice.   Your ultrasound is scheduled for today at 1:00 PM at [address] in the Vascular Department on the 4th floor. Please arrive by 12:45 PM. If you have any questions, you can contact them at [phone].  Blood work will need to be completed at [facility]. Please go to the 1st floor outpatient lab. If you have any questions regarding lab services, you can reach them at [phone].  You will be notified of the results of your imaging and blood work once available and further recommendations will be based on the findings.

## 2023-10-27 NOTE — ED Triage Notes (Addendum)
 Pt c/o right upper arm pain with movement started 2 wks ago. Pt states throughout the first week the pain started to become worse w/touch, then progressed to lower arm. Pt developed several ecchymotic areas on arm started last Thursday. Pt has scattered ecchymotic areas in different stages of healing on upper arm and AC area of right arm. Pt denies blood thinners. Pt has 1+ swelling of right arm. Pt has 2+ right radial pulse, cap refill less than 3 sec, warm to touch

## 2023-10-27 NOTE — Telephone Encounter (Signed)
 Ultrasound of the right arm negative for deep vein thrombosis or superficial vein thrombosis. Results of blood work is still pending at this time.

## 2023-10-27 NOTE — ED Notes (Signed)
 I called vascular on [address] and scheduled an appt for pt to go get US  done today for RUE at 1300. Pt is aware.

## 2023-10-28 LAB — T4: T4, Total: 8.4 ug/dL (ref 4.5–12.0)

## 2023-10-29 ENCOUNTER — Telehealth (HOSPITAL_COMMUNITY): Payer: Self-pay | Admitting: Nurse Practitioner

## 2023-10-29 NOTE — Telephone Encounter (Signed)
 Labs did not reveal any findings that would explain patient's acute bruising. She should follow-up with PCP for further evaluation and possible referral to hematology if symptoms persist or worsen.

## 2023-10-30 ENCOUNTER — Ambulatory Visit: Payer: Self-pay | Admitting: Family Medicine

## 2023-11-02 LAB — VITAMIN K1, SERUM: VITAMIN K1: 1.41 ng/mL (ref 0.10–2.20)

## 2023-11-07 ENCOUNTER — Encounter (HOSPITAL_COMMUNITY): Payer: Self-pay

## 2023-11-12 NOTE — Progress Notes (Unsigned)
 Cardiology Office Note:  .   Date:  11/13/2023  ID:  Paula Huffman, DOB 03/23/1971, MRN [id number] PCP: Allwardt, Deleta Felix, PA-C  [facility] Providers Cardiologist:  None    Patient Profile: .      PMH Hypertension Hyperlipidemia Evaded LP(a) PACs/PVCs Diabetes Graves' disease Coronary calcium score of 0 on CT 08/2023 Family history heart disease Father MI around 55 years old Paternal grandfather underwent bypass surgery Paternal great uncle had MI Maternal grand mother had CHF  She was referred to cardiology and seen by Dr. Theodis Fiscal on 09/01/23 for palpitations and shortness of breath following a COVID infection.  Echo 05/2023 revealed LVEF 55 to 60%, normal diastolic function.  She wore an ambulatory monitor that revealed occasional PACs and PVCs but no significant arrhythmias.  She was told she had an enlarged heart on chest x-ray.  She reported exertional dyspnea characterized by sensation of wheezing and mild shortness of breath after climbing up stairs at home, occurring 30 to 60 seconds after reaching the top.  This began after having COVID-19 infection in late 2020.  She does not experience the symptoms during regular exercise such as treadmill workouts or weightlifting which she performs 1-3 times a week.  No swelling in her legs or feet and no shortness of breath while lying down.  Antihypertensive therapy at the time of visit was amlodipine  for a longer time with recent addition of, spironolactone .  She had also been started on pravastatin  for hyperlipidemia.  History of Graves' disease diagnosed in the late 1990s which was associated with palpitations.  She occasionally experiences sensations of skipped or delayed heartbeats, which occur sporadically and are not associated with caffeine intake she primarily consumes herbal tea and a small amount of dark chocolate.  She does not smoke and consumes alcohol minimally, occasionally 1-2 drinks per week.  She tries to  limit salt and carbohydrates in her diet and generally cooks at home.  She underwent coronary CTA 09/23/2023 to rule out CAD which revealed no evidence of CAD, calcium score of 0.  LP(a) elevated at 258.9.       History of Present Illness: .    History of Present Illness Paula Huffman is a very pleasant 53 year old female who presents today for follow-up of  hyperlipidemia.  She reports she ran out of pravastatin  about 3 weeks ago.  Prior to this time, she had noted some upper leg twitching occurring daily, felt to have improved since being off pravastatin . Palpitations occurred once, attributed to work stress, without associated chest pain, tachycardia, or dyspnea during exercise. She feels winded after climbing stairs, a symptom that began post-COVID-19 infection, but she is able to walk on the treadmill for 20+ minutes without shortness of breath. Recent episode of unexplained bruising on her arm led to urgent care, where an ultrasound and blood work showed no clotting issues, but fibrinogen  was slightly elevated at 500 mg/dL. She has no personal or family history of clotting disorders. Her diet includes low-fat dairy and occasional fried foods, with efforts to increase vegetable intake. She is going to the [facility] 3 x per week and incorporating various exercises in addition to cardio exercise. She is working on increasing her endurance.  She denies chest pain, orthopnea, PND, edema, presyncope, syncope.  Discussed the use of AI scribe software for clinical note transcription with the patient, who gave verbal consent to proceed.   ROS: See HPI       Studies  Reviewed: Aaron Aas         Lipoprotein (a)  Date/Time Value Ref Range Status  09/16/2023 04:38 PM 258.9 (H) <75.0 nmol/L Final    Comment:    **Results verified by repeat testing** Note:  Values greater than or equal to 75.0 nmol/L may        indicate an independent risk factor for CHD,        but must be evaluated with caution when  applied        to non-Caucasian populations due to the        influence of genetic factors on Lp(a) across        ethnicities.      Risk Assessment/Calculations:             Physical Exam:   VS:  BP 128/72 (BP Location: Left Arm, Patient Position: Sitting, Cuff Size: Normal)   Pulse 78   Ht 5' 3.75" (1.619 m)   Wt 190 lb 12.8 oz (86.5 kg)   LMP 08/05/2023   SpO2 96%   BMI 33.01 kg/m    Wt Readings from Last 3 Encounters:  11/13/23 190 lb 12.8 oz (86.5 kg)  09/01/23 192 lb 6.4 oz (87.3 kg)  07/17/23 189 lb 6.4 oz (85.9 kg)    GEN: Well nourished, well developed in no acute distress NECK: No JVD; No carotid bruits CARDIAC: RRR, soft systolic murmur. No rubs, gallops RESPIRATORY:  Clear to auscultation without rales, wheezing or rhonchi  ABDOMEN: Soft, non-tender, non-distended EXTREMITIES:  No edema; No deformity     ASSESSMENT AND PLAN: .    Assessment & Plan Mild mitral valve regurgitation   Soft systolic murmur on exam. Mild MR on echo 05/2023. She is asymptomatic.  Advised probably following up in 3 to 5 years, sooner if clinically indicated. Symptoms to report reviewed.    Nonobstructive CAD Coronary CT (5/25 with CAC score of 0, total plaque volume is 9 mm 41st percentile). She denies chest pain, dyspnea, or other symptoms concerning for angina.  No indication for further ischemic evaluation at this time. Focus on secondary prevention including heart healthy mostly plant based diet avoiding saturated fat, processed foods, simple carbohydrates, and sugar along with aiming for at least 150 minutes of moderate intensity exercise each week.  We will recheck lipid panel when she can return fasting. Continue aspirin.  Palpitations Quiescent at this time.   Hypertension BP is well controlled. Renal function stable on labs completed 10/27/23. No change in anti-hypertensive therapy today.   Hyperlipidemia LDL goal < 70 Lipid panel, 03/12/23 with total cholesterol 237, HDL 50,  LDL 131, and triglycerides 278.  LDL goal less than 70 due to history of DM.  Intolerant of multiple statins, including pravastatin  most recently. We discussed potential non-statin treatment options. We will need to check cost.   DOE   Residual exertional dyspnea post-COVID-19 is transient and resolves quickly. Occurs 90 seconds after climbing stairs. No significant issues during regular exercise. Discussed exercise goals and the potential for trainer consultation. Encourage a gradual increase in exercise intensity to build endurance.          Disposition:1 year with Dr. Theodis Fiscal or APP  Signed, Slater Duncan, NP-C

## 2023-11-13 ENCOUNTER — Encounter (HOSPITAL_BASED_OUTPATIENT_CLINIC_OR_DEPARTMENT_OTHER): Payer: Self-pay | Admitting: Nurse Practitioner

## 2023-11-13 ENCOUNTER — Ambulatory Visit (HOSPITAL_BASED_OUTPATIENT_CLINIC_OR_DEPARTMENT_OTHER): Admitting: Nurse Practitioner

## 2023-11-13 VITALS — BP 128/72 | HR 78 | Ht 63.75 in | Wt 190.8 lb

## 2023-11-13 DIAGNOSIS — R002 Palpitations: Secondary | ICD-10-CM

## 2023-11-13 DIAGNOSIS — R0609 Other forms of dyspnea: Secondary | ICD-10-CM

## 2023-11-13 DIAGNOSIS — I2510 Atherosclerotic heart disease of native coronary artery without angina pectoris: Secondary | ICD-10-CM

## 2023-11-13 DIAGNOSIS — I340 Nonrheumatic mitral (valve) insufficiency: Secondary | ICD-10-CM | POA: Diagnosis not present

## 2023-11-13 DIAGNOSIS — I10 Essential (primary) hypertension: Secondary | ICD-10-CM

## 2023-11-13 DIAGNOSIS — E785 Hyperlipidemia, unspecified: Secondary | ICD-10-CM

## 2023-11-13 NOTE — Patient Instructions (Signed)
 Medication Instructions:  Your physician recommends that you continue on your current medications as directed. Please refer to the Current Medication list given to you today.  *If you need a refill on your cardiac medications before your next appointment, please call your pharmacy*  Lab Work: Lipid panel If you have labs (blood work) drawn today and your tests are completely normal, you will receive your results only by: MyChart Message (if you have MyChart) OR A paper copy in the mail If you have any lab test that is abnormal or we need to change your treatment, we will call you to review the results.  Testing/Procedures: NONE  Follow-Up: At [facility], you and your health needs are our priority.  As part of our continuing mission to provide you with exceptional heart care, our providers are all part of one team.  This team includes your primary Cardiologist (physician) and Advanced Practice Providers or APPs (Physician Assistants and Nurse Practitioners) who all work together to provide you with the care you need, when you need it.  Your next appointment:   1 year(s)  Provider:   Steen Eden or Theodis Fiscal  We recommend signing up for the patient portal called "MyChart".  Sign up information is provided on this After Visit Summary.  MyChart is used to connect with patients for Virtual Visits (Telemedicine).  Patients are able to view lab/test results, encounter notes, upcoming appointments, etc.  Non-urgent messages can be sent to your provider as well.   To learn more about what you can do with MyChart, go to [email or web address].   Other Instructions

## 2023-11-14 ENCOUNTER — Encounter: Payer: Self-pay | Admitting: Physician Assistant

## 2023-11-14 ENCOUNTER — Ambulatory Visit: Payer: 59 | Admitting: Physician Assistant

## 2023-11-14 VITALS — BP 130/80 | HR 75 | Temp 98.1°F | Ht 63.0 in | Wt 193.0 lb

## 2023-11-14 DIAGNOSIS — E890 Postprocedural hypothyroidism: Secondary | ICD-10-CM | POA: Diagnosis not present

## 2023-11-14 DIAGNOSIS — M2550 Pain in unspecified joint: Secondary | ICD-10-CM

## 2023-11-14 DIAGNOSIS — R233 Spontaneous ecchymoses: Secondary | ICD-10-CM | POA: Diagnosis not present

## 2023-11-14 DIAGNOSIS — E669 Obesity, unspecified: Secondary | ICD-10-CM | POA: Insufficient documentation

## 2023-11-14 DIAGNOSIS — M359 Systemic involvement of connective tissue, unspecified: Secondary | ICD-10-CM | POA: Insufficient documentation

## 2023-11-14 DIAGNOSIS — E1165 Type 2 diabetes mellitus with hyperglycemia: Secondary | ICD-10-CM

## 2023-11-14 DIAGNOSIS — Z7984 Long term (current) use of oral hypoglycemic drugs: Secondary | ICD-10-CM | POA: Diagnosis not present

## 2023-11-14 LAB — MICROALBUMIN / CREATININE URINE RATIO
Creatinine,U: 46.2 mg/dL
Microalb Creat Ratio: UNDETERMINED mg/g (ref 0.0–30.0)
Microalb, Ur: <0.7 mg/dL

## 2023-11-14 LAB — POCT GLYCOSYLATED HEMOGLOBIN (HGB A1C): Hemoglobin A1C: 6.7 % — AB (ref 4.0–5.6)

## 2023-11-14 NOTE — Progress Notes (Signed)
 Patient ID: Paula Huffman, female    DOB: 04/09/1971, 53 y.o.   MRN: [id number]   Assessment & Plan:   Type 2 diabetes mellitus with hyperglycemia, without long-term current use of insulin (HCC) -     POCT glycosylated hemoglobin (Hb A1C) -     Microalbumin / creatinine urine ratio  Postablative hypothyroidism -     TSH -     T4, free -     T3, free      Hyperthyroidism, unspecified Recent labs indicate potential hyperthyroidism with low TSH at 0.125. She reports feeling jumpy, correlating with low TSH levels. No missed doses of medication reported. - Order TSH and free T4 labs to reassess thyroid  function. - Discuss potential adjustment of thyroid  medication based on lab results.  Thyroid  disorder Long-standing thyroid  disorder with previous frequent monitoring. Current management under review due to recent lab findings.  Type 2 diabetes mellitus without complications A1c is well-controlled at 6.7, showing improvement from previous values of 6.5 and 7.7. - Continue metformin  500 mg twice daily. - Schedule follow-up in 6 months with fasting labs to include kidney function and cholesterol.  Osteoarthritis Confirmed by rheumatology with x-rays. Symptoms include stiffness, especially after sitting or driving. Rheumatology advised stretching and anti-inflammatory foods. - Encourage stretching exercises to manage stiffness. - Consider anti-inflammatory foods such as turmeric, blueberries, almonds, and dark chocolate.  Easy bruising Recent unexplained bruising on the right upper arm. Doppler scan showed no clotting issues. Aspirin use may have contributed. Elevated clotting factor noted but not significantly high. No family history of bleeding or clotting disorders. Cardiologist team focused on cholesterol management. - Discontinue aspirin unless cardiologist advises otherwise. - Monitor for recurrent bruising. If persistent, consider referral to  hematology.       Return in about 6 months (around 05/16/2024) for recheck/follow-up / fasting labs .    Subjective:    Chief Complaint  Patient presents with   Diabetes    Pt in office for diabetes follow up; pt due for A1C and urine; wants to discuss uc visit a few weeks ago as well.     HPI Discussed the use of AI scribe software for clinical note transcription with the patient, who gave verbal consent to proceed.  History of Present Illness Paula Huffman is a 53 year old female who presents with unexplained bruising and thyroid  level concerns.  She has been experiencing unexplained bruising on her right upper arm, which began with soreness during movement, particularly while on the treadmill. Initially, the soreness felt like muscle overuse, but she was not engaging in strenuous activity. Over two weeks, the soreness spread to other areas of her arm, feeling bruised to the touch without visible bruising. Subsequently, visible purplish bruises appeared, prompting her to seek urgent care on April 20. A Doppler scan showed no clotting issues. She was taking aspirin three times a week, which she has since stopped.  She has a history of osteoarthritis, confirmed by rheumatology through x-rays and blood work, which ruled out rheumatoid arthritis. She experiences stiffness, particularly when getting up from sitting or driving, and manages it with stretching. She has not been taking turmeric regularly.  She reports feeling 'jumpy' and noticed her thyroid  number was 'out of whack.' She has a history of thyroid  disorder diagnosed in the 1990s and is familiar with frequent monitoring. She is currently on metformin  500 mg twice a day for diabetes management, with her A1c at 6.7, improved from 7.7  previously.  She has no family history of bleeding or clotting disorders and has not experienced any recent falls or injuries that could explain the bruising.     Past Medical History:   Diagnosis Date   Anal fissure    Anemia    GERD (gastroesophageal reflux disease)    History of reactive airway disease    Hyperlipidemia    Hypertension    Thyroid  disease    HYPOTHYROID    Past Surgical History:  Procedure Laterality Date   APPENDECTOMY     AGE 79   BREAST SURGERY     LEFT BREAST EXCISION;CALCIFICATION REMOVED   CESAREAN SECTION     WISDOM TOOTH EXTRACTION      Family History  Problem Relation Age of Onset   Thyroid  disease Mother    Hypertension Mother    Diabetes Mother    Heart attack Father 51   Heart disease Father    Hypertension Father    Thyroid  disease Maternal Aunt    Heart failure Maternal Aunt    Hypertension Paternal Aunt    Heart attack Paternal Uncle    Thyroid  disease Maternal Grandmother    Hypertension Maternal Grandmother    Stroke Maternal Grandmother    Heart failure Maternal Grandmother    Hypertension Maternal Grandfather    Coronary artery disease Paternal Grandmother    Hypertension Paternal Grandfather    Heart attack Paternal Grandfather    Asthma Daughter    Heart attack Other     Social History   Tobacco Use   Smoking status: Never   Smokeless tobacco: Never  Substance Use Topics   Alcohol use: Yes    Comment: occ   Drug use: No     Allergies  Allergen Reactions   Penicillins Other (See Comments) and Rash   Azithromycin Diarrhea, Nausea And Vomiting, Other (See Comments) and Nausea Only    Zithromax   Doxycycline Other (See Comments)   Hydrocodone-Acetaminophen Other (See Comments)   Penicillin G Other (See Comments)   Vicodin [Hydrocodone-Acetaminophen]     Review of Systems NEGATIVE UNLESS OTHERWISE INDICATED IN HPI      Objective:     BP 130/80 (BP Location: Left Arm, Patient Position: Sitting, Cuff Size: Normal)   Pulse 75   Temp 98.1 F (36.7 C) (Temporal)   Ht 5\' 3"  (1.6 m)   Wt 193 lb (87.5 kg)   LMP 08/05/2023   SpO2 98%   BMI 34.19 kg/m   Wt Readings from Last 3 Encounters:   11/14/23 193 lb (87.5 kg)  11/13/23 190 lb 12.8 oz (86.5 kg)  09/01/23 192 lb 6.4 oz (87.3 kg)    BP Readings from Last 3 Encounters:  11/14/23 130/80  11/13/23 128/72  10/27/23 130/82     Physical Exam Vitals and nursing note reviewed.  Constitutional:      Appearance: Normal appearance. She is normal weight. She is not toxic-appearing.  HENT:     Right Ear: External ear normal.     Left Ear: External ear normal.     Mouth/Throat:     Mouth: Mucous membranes are moist.  Eyes:     Extraocular Movements: Extraocular movements intact.     Conjunctiva/sclera: Conjunctivae normal.     Pupils: Pupils are equal, round, and reactive to light.  Cardiovascular:     Rate and Rhythm: Normal rate and regular rhythm.     Pulses: Normal pulses.     Heart sounds: Normal heart sounds.  Pulmonary:  Effort: Pulmonary effort is normal.     Breath sounds: No wheezing.  Musculoskeletal:        General: No swelling, tenderness or deformity. Normal range of motion.     Cervical back: Normal range of motion and neck supple.  Skin:    General: Skin is warm and dry.  Neurological:     General: No focal deficit present.     Mental Status: She is alert and oriented to person, place, and time.  Psychiatric:        Mood and Affect: Mood normal.        Behavior: Behavior normal.             Asmi Fugere M Keali Mccraw, PA-C

## 2023-11-15 LAB — T3, FREE: T3, Free: 2.7 pg/mL (ref 2.3–4.2)

## 2023-11-15 LAB — TSH: TSH: 0.67 m[IU]/L

## 2023-11-15 LAB — T4, FREE: Free T4: 1.4 ng/dL (ref 0.8–1.8)

## 2023-11-16 ENCOUNTER — Ambulatory Visit: Payer: Self-pay | Admitting: Physician Assistant

## 2023-11-18 ENCOUNTER — Telehealth: Payer: Self-pay | Admitting: Pharmacy Technician

## 2023-11-18 ENCOUNTER — Ambulatory Visit (HOSPITAL_BASED_OUTPATIENT_CLINIC_OR_DEPARTMENT_OTHER): Payer: Self-pay | Admitting: Nurse Practitioner

## 2023-11-18 ENCOUNTER — Other Ambulatory Visit (HOSPITAL_COMMUNITY): Payer: Self-pay

## 2023-11-18 DIAGNOSIS — E785 Hyperlipidemia, unspecified: Secondary | ICD-10-CM

## 2023-11-18 LAB — LIPID PANEL
Chol/HDL Ratio: 4.9 ratio — ABNORMAL HIGH (ref 0.0–4.4)
Cholesterol, Total: 249 mg/dL — ABNORMAL HIGH (ref 100–199)
HDL: 51 mg/dL (ref >39)
LDL Chol Calc (NIH): 159 mg/dL — ABNORMAL HIGH (ref 0–99)
Triglycerides: 215 mg/dL — ABNORMAL HIGH (ref 0–149)
VLDL Cholesterol Cal: 39 mg/dL (ref 5–40)

## 2023-11-18 NOTE — Telephone Encounter (Signed)
 Pharmacy Patient Advocate Encounter   Received notification from Physician's Office that prior authorization for Repatha is required/requested.   Insurance verification completed.   The patient is insured through rx advance pres[employer] .   Per test claim: PA required; PA submitted to above mentioned insurance via CoverMyMeds Key/confirmation #/EOC [id number] Status is pending

## 2023-11-18 NOTE — Telephone Encounter (Signed)
 Please check prices of Nexletol and [id number] She has been unable to tolerate rosuvastatin, atorvastatin and pravastatin 

## 2023-11-18 NOTE — Telephone Encounter (Signed)
 Pharmacy Patient Advocate Encounter   Received notification from Physician's Office that prior authorization for Nexletol is required/requested.   Insurance verification completed.   The patient is insured through rx advance prescrip .   Per test claim: PA required; PA submitted to above mentioned insurance via CoverMyMeds Key/confirmation #/EOC [id number] Status is pending

## 2023-11-18 NOTE — Telephone Encounter (Signed)
 Pharmacy Patient Advocate Encounter   Received notification from Physician's Office that prior authorization for Repatha is required/requested.   Insurance verification completed.   The patient is insured through rx advance prescrip .   Per test claim: PA required; PA submitted to above mentioned insurance via CoverMyMeds Key/confirmation #/EOC [id number] Status is pending

## 2023-11-18 NOTE — Telephone Encounter (Signed)
 Pharmacy Patient Advocate Encounter  Received notification from rx advance presc[employer] that Prior Authorization for Nexletol has been APPROVED from 11/18/23 to 11/18/26. Ran test claim, Copay is $25.00- one month. This test claim was processed through [facility]- copay amounts may vary at other pharmacies due to pharmacy/plan contracts, or as the patient moves through the different stages of their insurance plan.   PA #/Case ID/Reference #: [id number]

## 2023-11-18 NOTE — Telephone Encounter (Signed)
 Pharmacy Patient Advocate Encounter  Received notification from rx advance prescrip that Prior Authorization for repatha has been APPROVED from 11/18/23 to 11/17/24. Ran test claim, Copay is $30.00. This test claim was processed through [facility]- copay amounts may vary at other pharmacies due to pharmacy/plan contracts, or as the patient moves through the different stages of their insurance plan.   PA #/Case ID/Reference #: [id number]

## 2023-11-25 NOTE — Telephone Encounter (Signed)
 Please review and advise.

## 2023-11-27 MED ORDER — NEXLETOL 180 MG PO TABS: 1.0000 | ORAL_TABLET | Freq: Every day | ORAL | 3 refills | Status: DC

## 2023-11-27 NOTE — Telephone Encounter (Signed)
 If she felt poorly on Pravastatin , okay to remain off as she was not taking. If she felt well taking it, okay to resume.  Okay to send 30 or 90 day supply, she may prefer 30 day as trying something new. Can offer free 30-day coupon, copay card, and 2 week samples.  Shante Archambeault S Giavanni Odonovan, NP

## 2023-11-27 NOTE — Addendum Note (Signed)
 Addended by: Guss Legacy on: 11/27/2023 07:16 AM   Modules accepted: Orders

## 2024-02-12 ENCOUNTER — Other Ambulatory Visit: Payer: Self-pay | Admitting: Physician Assistant

## 2024-02-12 DIAGNOSIS — I10 Essential (primary) hypertension: Secondary | ICD-10-CM

## 2024-02-16 NOTE — Telephone Encounter (Signed)
 Last OV 11/14/23, f/u 6 months Next OV 05/18/24  Last refill 10/14/23 Qty #90/0

## 2024-02-27 ENCOUNTER — Other Ambulatory Visit: Payer: Self-pay | Admitting: Physician Assistant

## 2024-04-06 ENCOUNTER — Other Ambulatory Visit: Payer: Self-pay | Admitting: Obstetrics and Gynecology

## 2024-04-06 DIAGNOSIS — Z1231 Encounter for screening mammogram for malignant neoplasm of breast: Secondary | ICD-10-CM

## 2024-04-20 ENCOUNTER — Ambulatory Visit
Admission: RE | Admit: 2024-04-20 | Discharge: 2024-04-20 | Disposition: A | Discharge status: Home / Self Care | Source: Ambulatory Visit | Attending: Family Medicine | Admitting: Family Medicine

## 2024-04-20 VITALS — BP 132/71 | HR 98 | Temp 99.7°F | Resp 20

## 2024-04-20 DIAGNOSIS — J4520 Mild intermittent asthma, uncomplicated: Secondary | ICD-10-CM | POA: Diagnosis not present

## 2024-04-20 DIAGNOSIS — B9789 Other viral agents as the cause of diseases classified elsewhere: Secondary | ICD-10-CM | POA: Diagnosis not present

## 2024-04-20 DIAGNOSIS — J988 Other specified respiratory disorders: Secondary | ICD-10-CM

## 2024-04-20 DIAGNOSIS — J309 Allergic rhinitis, unspecified: Secondary | ICD-10-CM

## 2024-04-20 MED ORDER — PREDNISONE 20 MG PO TABS: ORAL_TABLET | ORAL | 0 refills | Status: DC

## 2024-04-20 MED ORDER — CETIRIZINE HCL 10 MG PO TABS
10.0000 mg | ORAL_TABLET | Freq: Every day | ORAL | 0 refills | Status: AC
Start: 2024-04-20 — End: ?

## 2024-04-20 MED ORDER — PROMETHAZINE-DM 6.25-15 MG/5ML PO SYRP: 5.0000 mL | ORAL_SOLUTION | Freq: Three times a day (TID) | ORAL | 0 refills | Status: DC | PRN

## 2024-04-20 NOTE — ED Triage Notes (Signed)
 Pt c/o prod cough, head/chest congestion, scratchy throat-sx started 10/15-denies fever-taking robitussin and mucinex-NAD-steady gait

## 2024-04-20 NOTE — Discharge Instructions (Addendum)
 We will manage this as a viral respiratory infection. For sore throat or cough try using a honey-based tea. Use 3 teaspoons of honey with juice squeezed from half lemon. Place shaved pieces of ginger into 1/2-1 cup of water and warm over stove top. Then mix the ingredients and repeat every 4 hours as needed. Please take Tylenol 500mg -650mg  once every 6 hours for fevers, aches and pains. Hydrate very well with at least 2 liters (64 ounces) of water. Eat light meals such as soups (chicken and noodles, chicken wild rice, vegetable).  Do not eat any foods that you are allergic to.  Start an antihistamine like Zyrtec (10mg  daily) for postnasal drainage, sinus congestion.  You can take this together with prednisone. Use cough syrup as needed.

## 2024-04-20 NOTE — ED Provider Notes (Signed)
 [facility]  Note:  This document was prepared using Conservation officer, historic buildings and may include unintentional dictation errors.  MRN: [id number] DOB: [birth year]  Subjective:   Paula Huffman is a 53 y.o. female presenting for 5-day history of a productive hacking cough with coughing spells, sinus and chest congestion, scratchy throat, intermittent wheezing.  No fever, chest pain, shortness of breath, ear pain, rashes.  Last A1c was less than 7% in 10/2023. Has allergic rhinitis, has a history of intermittent reactive airways. No smoking of any kind including cigarettes, cigars, vaping, marijuana use.    No current facility-administered medications for this encounter.  Current Outpatient Medications:    albuterol  (VENTOLIN  HFA) 108 (90 Base) MCG/ACT inhaler, Inhale 1-2 puffs into the lungs every 6 (six) hours as needed for wheezing or shortness of breath., Disp: 18 g, Rfl: 0   amLODipine  (NORVASC ) 5 MG tablet, Take 1 tablet (5 mg total) by mouth daily., Disp: 90 tablet, Rfl: 3   aspirin 81 MG chewable tablet, Chew 81 mg by mouth as needed. 3 times weekly, Disp: , Rfl:    Bempedoic Acid  (NEXLETOL ) 180 MG TABS, Take 1 tablet (180 mg total) by mouth daily at 6 (six) AM., Disp: 90 tablet, Rfl: 3   fluticasone (FLONASE) 50 MCG/ACT nasal spray, Place 1 spray into both nostrils as needed. PRN, Disp: , Rfl:    glucose blood test strip, Patient to check glucose levels one time daily, Disp: 100 each, Rfl: 2   levothyroxine  (SYNTHROID ) 137 MCG tablet, Take 1 tablet (137 mcg total) by mouth daily before breakfast., Disp: 90 tablet, Rfl: 3   metFORMIN  (GLUCOPHAGE -XR) 500 MG 24 hr tablet, TAKE 1 TABLET BY MOUTH 2 TIMES DAILY WITH A MEAL., Disp: 180 tablet, Rfl: 1   Multiple Vitamins-Minerals (MULTIVITAMIN ADULT, MINERALS, PO), , Disp: , Rfl:    norethindrone  (MICRONOR ,CAMILA ,ERRIN ) 0.35 MG tablet, Take 1 tablet (0.35 mg total) by mouth daily., Disp: 1 Package, Rfl: 11    pravastatin  (PRAVACHOL ) 40 MG tablet, Take 40 mg by mouth daily., Disp: , Rfl:    spironolactone  (ALDACTONE ) 25 MG tablet, TAKE 1 TABLET (25 MG TOTAL) BY MOUTH DAILY., Disp: 90 tablet, Rfl: 0   Allergies  Allergen Reactions   Penicillins Other (See Comments) and Rash   Azithromycin Diarrhea, Nausea And Vomiting, Other (See Comments) and Nausea Only    Zithromax   Doxycycline Other (See Comments)   Hydrocodone-Acetaminophen Other (See Comments)   Penicillin G Other (See Comments)   Vicodin [Hydrocodone-Acetaminophen]     Past Medical History:  Diagnosis Date   Anal fissure    Anemia    GERD (gastroesophageal reflux disease)    History of reactive airway disease    Hyperlipidemia    Hypertension    Thyroid  disease    HYPOTHYROID     Past Surgical History:  Procedure Laterality Date   APPENDECTOMY     AGE 78   BREAST SURGERY     LEFT BREAST EXCISION;CALCIFICATION REMOVED   CESAREAN SECTION     WISDOM TOOTH EXTRACTION      Family History  Problem Relation Age of Onset   Thyroid  disease Mother    Hypertension Mother    Diabetes Mother    Heart attack Father 76   Heart disease Father    Hypertension Father    Thyroid  disease Maternal Aunt    Heart failure Maternal Aunt    Hypertension Paternal Aunt    Heart attack Paternal Uncle  Thyroid  disease Maternal Grandmother    Hypertension Maternal Grandmother    Stroke Maternal Grandmother    Heart failure Maternal Grandmother    Hypertension Maternal Grandfather    Coronary artery disease Paternal Grandmother    Hypertension Paternal Grandfather    Heart attack Paternal Grandfather    Asthma Daughter    Heart attack Other     Social History   Tobacco Use   Smoking status: Never   Smokeless tobacco: Never  Vaping Use   Vaping status: Never Used  Substance Use Topics   Alcohol use: Yes    Comment: occ   Drug use: No    ROS   Objective:   Vitals: BP 132/71 (BP Location: Right Arm)   Pulse 98   Temp  99.7 F (37.6 C) (Oral)   Resp 20   SpO2 95%   Physical Exam Constitutional:      General: She is not in acute distress.    Appearance: Normal appearance. She is well-developed and normal weight. She is not ill-appearing, toxic-appearing or diaphoretic.  HENT:     Head: Normocephalic and atraumatic.     Right Ear: Tympanic membrane, ear canal and external ear normal. No drainage or tenderness. No middle ear effusion. There is no impacted cerumen. Tympanic membrane is not erythematous or bulging.     Left Ear: Tympanic membrane, ear canal and external ear normal. No drainage or tenderness.  No middle ear effusion. There is no impacted cerumen. Tympanic membrane is not erythematous or bulging.     Nose: Congestion present. No rhinorrhea.     Mouth/Throat:     Mouth: Mucous membranes are moist. No oral lesions.     Pharynx: No pharyngeal swelling, oropharyngeal exudate, posterior oropharyngeal erythema or uvula swelling.     Tonsils: No tonsillar exudate or tonsillar abscesses.     Comments: Thick streaks of postnasal drainage overlying pharynx.  Eyes:     General: No scleral icterus.       Right eye: No discharge.        Left eye: No discharge.     Extraocular Movements: Extraocular movements intact.     Right eye: Normal extraocular motion.     Left eye: Normal extraocular motion.     Conjunctiva/sclera: Conjunctivae normal.  Cardiovascular:     Rate and Rhythm: Normal rate and regular rhythm.     Heart sounds: Normal heart sounds. No murmur heard.    No friction rub. No gallop.  Pulmonary:     Effort: Pulmonary effort is normal. No respiratory distress.     Breath sounds: No stridor. No wheezing, rhonchi or rales.  Chest:     Chest wall: No tenderness.  Musculoskeletal:     Cervical back: Normal range of motion and neck supple.  Lymphadenopathy:     Cervical: No cervical adenopathy.  Skin:    General: Skin is warm and dry.  Neurological:     General: No focal deficit  present.     Mental Status: She is alert and oriented to person, place, and time.  Psychiatric:        Mood and Affect: Mood normal.        Behavior: Behavior normal.     Assessment and Plan :   PDMP not reviewed this encounter.  1. Viral respiratory infection   2. Mild intermittent reactive airway disease without complication   3. Allergic rhinitis, unspecified seasonality, unspecified trigger    Suspect viral etiology.  Given her significant allergic  rhinitis and reactive airways offered an oral prednisone  course.  Recommend supportive care otherwise.  Practice strict diabetic friendly diet.  If patient remains symptomatic following the prednisone  course can call for an oral antibiotic this upcoming weekend or come back for a check if she would like to have a recheck for pneumonia.  Counseled patient on potential for adverse effects with medications prescribed/recommended today, ER and return-to-clinic precautions discussed, patient verbalized understanding.    Christopher Savannah, PA-C 04/20/24 1009

## 2024-04-26 ENCOUNTER — Other Ambulatory Visit: Payer: Self-pay | Admitting: Physician Assistant

## 2024-04-26 DIAGNOSIS — I10 Essential (primary) hypertension: Secondary | ICD-10-CM

## 2024-05-18 ENCOUNTER — Ambulatory Visit: Admitting: Physician Assistant

## 2024-05-18 ENCOUNTER — Encounter: Payer: Self-pay | Admitting: Physician Assistant

## 2024-05-18 VITALS — BP 130/82 | HR 91 | Temp 97.7°F | Resp 14 | Ht 63.0 in | Wt 199.2 lb

## 2024-05-18 DIAGNOSIS — E1165 Type 2 diabetes mellitus with hyperglycemia: Secondary | ICD-10-CM | POA: Diagnosis not present

## 2024-05-18 DIAGNOSIS — Z789 Other specified health status: Secondary | ICD-10-CM | POA: Insufficient documentation

## 2024-05-18 DIAGNOSIS — E890 Postprocedural hypothyroidism: Secondary | ICD-10-CM

## 2024-05-18 DIAGNOSIS — J45909 Unspecified asthma, uncomplicated: Secondary | ICD-10-CM | POA: Insufficient documentation

## 2024-05-18 DIAGNOSIS — Z7984 Long term (current) use of oral hypoglycemic drugs: Secondary | ICD-10-CM

## 2024-05-18 DIAGNOSIS — Z23 Encounter for immunization: Secondary | ICD-10-CM | POA: Diagnosis not present

## 2024-05-18 DIAGNOSIS — E782 Mixed hyperlipidemia: Secondary | ICD-10-CM

## 2024-05-18 DIAGNOSIS — J4520 Mild intermittent asthma, uncomplicated: Secondary | ICD-10-CM

## 2024-05-18 LAB — HEMOGLOBIN A1C: Hgb A1c MFr Bld: 7.7 % — ABNORMAL HIGH (ref 4.6–6.5)

## 2024-05-18 LAB — COMPREHENSIVE METABOLIC PANEL WITH GFR
ALT: 23 U/L (ref 0–35)
AST: 22 U/L (ref 0–37)
Albumin: 4.5 g/dL (ref 3.5–5.2)
Alkaline Phosphatase: 71 U/L (ref 39–117)
BUN: 10 mg/dL (ref 6–23)
CO2: 28 meq/L (ref 19–32)
Calcium: 9.6 mg/dL (ref 8.4–10.5)
Chloride: 102 meq/L (ref 96–112)
Creatinine, Ser: 0.98 mg/dL (ref 0.40–1.20)
GFR: 65.06 mL/min (ref >60.00)
Glucose, Bld: 151 mg/dL — ABNORMAL HIGH (ref 70–99)
Potassium: 3.5 meq/L (ref 3.5–5.1)
Sodium: 141 meq/L (ref 135–145)
Total Bilirubin: 0.4 mg/dL (ref 0.2–1.2)
Total Protein: 7.8 g/dL (ref 6.0–8.3)

## 2024-05-18 LAB — TSH: TSH: 32.28 u[IU]/mL — ABNORMAL HIGH (ref 0.35–5.50)

## 2024-05-18 MED ORDER — LEVOTHYROXINE SODIUM 137 MCG PO TABS: 137.0000 ug | ORAL_TABLET | Freq: Every day | ORAL | 0 refills | Status: AC

## 2024-05-18 NOTE — Patient Instructions (Signed)
  VISIT SUMMARY: Today, we reviewed your management plan for type 2 diabetes, hypothyroidism, hypertension, hyperlipidemia, and mild intermittent asthma. We also discussed general health maintenance, including vaccinations.  YOUR PLAN: TYPE 2 DIABETES MELLITUS: Your diabetes is managed with metformin , and your last A1c was 6.7% in May, indicating good control. However, recent lack of exercise may affect your blood sugar levels. -Continue taking metformin  500 mg twice daily. -We have ordered an A1c test to check your current blood sugar control.  POSTPROCEDURAL HYPOTHYROIDISM: You have not taken your levothyroxine  since October due to an expired prescription. Your thyroid  levels were previously low, leading to a dose adjustment in April. -We have ordered a TSH test to assess your current thyroid  function. -Continue taking levothyroxine  137 mcg daily pending TSH results.  HYPERTENSION: Your blood pressure is well-controlled with amlodipine  and spironolactone . -Continue taking amlodipine  and spironolactone  as prescribed.  HYPERLIPIDEMIA WITH STATIN AND NONSTATIN INTOLERANCE: You have experienced intolerance to both statins and nonstatins, including Nexletol , due to lower leg pain. You are not currently on any cholesterol-lowering medication. -We have contacted your cardiology team regarding your intolerance to Nexletol  and the need for an alternative lipid-lowering therapy.  MILD INTERMITTENT ASTHMA: You experience reactive airway symptoms, particularly after viral infections, and use an albuterol  inhaler as needed. -Continue using your albuterol  inhaler as needed. -We will consider pulmonary function tests if your symptoms become more persistent or bothersome.  GENERAL HEALTH MAINTENANCE: We discussed your vaccinations, including flu, Prevnar, and shingles. You are due for a flu shot and have had shingles in the past. -You received your flu shot today. -We recommend you get the shingles  vaccine.                      Contains text generated by [employer]bridge.                                 Contains text generated by [employer]bridge.

## 2024-05-18 NOTE — Progress Notes (Signed)
 Patient ID: Paula Huffman, female    DOB: May 09, 1971, 53 y.o.   MRN: [id number]   Assessment & Plan:  Postablative hypothyroidism -     Levothyroxine  Sodium; Take 1 tablet (137 mcg total) by mouth daily before breakfast.  Dispense: 90 tablet; Refill: 0 -     TSH  Immunization due -     Flu vaccine trivalent PF, 6mos and older(Flulaval,Afluria,Fluarix,Fluzone)  Statin intolerance  Type 2 diabetes mellitus with hyperglycemia, without long-term current use of insulin (HCC) -     Hemoglobin A1c -     Comprehensive metabolic panel with GFR  Mild intermittent reactive airway disease without complication    Assessment & Plan Type 2 diabetes mellitus Managed with metformin  500 mg twice daily. Last A1c was 6.7% in May, indicating good control. Recent lack of exercise may affect glycemic control. - Continue metformin  500 mg twice daily - Ordered A1c test to assess current glycemic control Lab Results  Component Value Date   HGBA1C 6.7 (A) 11/14/2023   HGBA1C 6.5 (A) 07/17/2023   HGBA1C 7.7 (H) 03/12/2023     Postprocedural hypothyroidism Managed with levothyroxine  137 mcg daily. She has not taken levothyroxine  since October due to expired medication. Previous dose adjustment was made in April due to low thyroid  levels. - Ordered TSH test to assess current thyroid  function - Continue levothyroxine  137 mcg daily pending TSH results  Hypertension Managed with amlodipine  and spironolactone . Blood pressure was well-controlled during the visit. - Continue amlodipine  and spironolactone  as prescribed  Hyperlipidemia with statin and nonstatin intolerance Hyperlipidemia with intolerance to both statins and nonstatins, including Nexletol , due to lower leg pain. No current lipid-lowering therapy. Cardiologist to be contacted for further management. - Messaged cardiology team regarding Nexletol  intolerance and need for alternative lipid-lowering therapy  Mild intermittent  asthma Reactive airway symptoms, particularly post-viral infections. Uses albuterol  inhaler as needed. No current wheezing or significant respiratory distress. Pulmonary function tests may be considered if symptoms worsen. - Continue albuterol  inhaler as needed - Will consider pulmonary function tests if symptoms become more persistent or bothersome  General Health Maintenance Discussed vaccinations including flu, Prevnar, and shingles. She has had shingles in the past and is due for a flu shot. - Administered flu shot today - Recommended shingles vaccine      Return in about 4 months (around 09/15/2024) for recheck/follow-up.    Subjective:    Chief Complaint  Patient presents with   Annual Exam    Not fasting. Needs refills on levothyroxine .      HPI Discussed the use of AI scribe software for clinical note transcription with the patient, who gave verbal consent to proceed.  History of Present Illness Paula Huffman is a 53 year old female who presents for a regular follow-up visit.  She has type 2 diabetes managed with metformin  500 mg twice daily. Her last A1c was 6.7% in May 2025. She has not been exercising regularly due to personal circumstances but has recently resumed her routine.  She has hypothyroidism and takes levothyroxine  137 mcg daily. However, she has not taken her medication since October 2025 due to an expired prescription and personal circumstances. Her thyroid  levels were previously low, leading to a dose adjustment in April 2025.  She has hyperlipidemia and has experienced statin intolerance, reporting intense lower leg pain with Nexletol , a non-statin medication. She is not currently on any cholesterol-lowering medication and follows up with her cardiology team for management.  She has  a history of reactive airway disease, experiencing intense coughing, especially after viral infections. She uses an albuterol  inhaler as needed, which she rarely uses,  but notes feeling winded and experiencing constriction after coughing. No wheezing or breathing difficulties reported. No tightness in the chest.  She has experienced irregular menstrual cycles, with two periods in 2025 after nearly a year without one.  Her social history includes a recent period of increased stress due to her husband's health issues, which impacted her ability to manage her own health conditions.     Past Medical History:  Diagnosis Date   Anal fissure    Anemia    GERD (gastroesophageal reflux disease)    History of reactive airway disease    Hyperlipidemia    Hypertension    Thyroid  disease    HYPOTHYROID    Past Surgical History:  Procedure Laterality Date   APPENDECTOMY     AGE 50   BREAST SURGERY     LEFT BREAST EXCISION;CALCIFICATION REMOVED   CESAREAN SECTION     WISDOM TOOTH EXTRACTION      Family History  Problem Relation Age of Onset   Thyroid  disease Mother    Hypertension Mother    Diabetes Mother    Heart attack Father 79   Heart disease Father    Hypertension Father    Thyroid  disease Maternal Aunt    Heart failure Maternal Aunt    Hypertension Paternal Aunt    Heart attack Paternal Uncle    Thyroid  disease Maternal Grandmother    Hypertension Maternal Grandmother    Stroke Maternal Grandmother    Heart failure Maternal Grandmother    Hypertension Maternal Grandfather    Coronary artery disease Paternal Grandmother    Hypertension Paternal Grandfather    Heart attack Paternal Grandfather    Asthma Daughter    Heart attack Other     Social History   Tobacco Use   Smoking status: Never    Passive exposure: Never   Smokeless tobacco: Never  Vaping Use   Vaping status: Never Used  Substance Use Topics   Alcohol use: Yes    Comment: occ   Drug use: No     Allergies  Allergen Reactions   Penicillins Other (See Comments) and Rash   Azithromycin Diarrhea, Nausea And Vomiting, Nausea Only and Other (See Comments)     Zithromax   Doxycycline Other (See Comments)   Hydrocodone-Acetaminophen Other (See Comments)   Nexletol  [Bempedoic Acid ] Other (See Comments)    Severe pain in muscle-aches    Penicillin G Other (See Comments)   Pravastatin  Other (See Comments)    Severe muscle aches    Vicodin [Hydrocodone-Acetaminophen]     Review of Systems NEGATIVE UNLESS OTHERWISE INDICATED IN HPI      Objective:     BP 130/82   Pulse 91   Temp 97.7 F (36.5 C) (Temporal)   Resp 14   Ht 5' 3 (1.6 m)   Wt 199 lb 3.2 oz (90.4 kg)   SpO2 97%   BMI 35.29 kg/m   Wt Readings from Last 3 Encounters:  05/18/24 199 lb 3.2 oz (90.4 kg)  11/14/23 193 lb (87.5 kg)  11/13/23 190 lb 12.8 oz (86.5 kg)    BP Readings from Last 3 Encounters:  05/18/24 130/82  04/20/24 132/71  11/14/23 130/80     Physical Exam Vitals and nursing note reviewed.  Constitutional:      Appearance: Normal appearance. She is normal weight. She is not  toxic-appearing.  HENT:     Right Ear: External ear normal.     Left Ear: External ear normal.     Mouth/Throat:     Mouth: Mucous membranes are moist.  Eyes:     Extraocular Movements: Extraocular movements intact.     Conjunctiva/sclera: Conjunctivae normal.     Pupils: Pupils are equal, round, and reactive to light.  Cardiovascular:     Rate and Rhythm: Normal rate and regular rhythm.     Pulses: Normal pulses.     Heart sounds: Normal heart sounds.  Pulmonary:     Breath sounds: Normal breath sounds.  Musculoskeletal:        General: No swelling, tenderness or deformity. Normal range of motion.     Cervical back: Normal range of motion and neck supple.  Skin:    General: Skin is warm and dry.  Neurological:     General: No focal deficit present.     Mental Status: She is alert and oriented to person, place, and time.  Psychiatric:        Mood and Affect: Mood normal.        Behavior: Behavior normal.             Jona Erkkila M Cristan Scherzer, PA-C

## 2024-05-19 ENCOUNTER — Telehealth (HOSPITAL_BASED_OUTPATIENT_CLINIC_OR_DEPARTMENT_OTHER): Payer: Self-pay | Admitting: Nurse Practitioner

## 2024-05-19 ENCOUNTER — Encounter (HOSPITAL_BASED_OUTPATIENT_CLINIC_OR_DEPARTMENT_OTHER): Payer: Self-pay | Admitting: *Deleted

## 2024-05-19 NOTE — Telephone Encounter (Signed)
 Vm full. No answer on home phone. Will send mychart message.

## 2024-05-19 NOTE — Telephone Encounter (Signed)
 Notified by PCP that patient did not tolerate Nexletol . What were the side effects she experienced?  Repatha would be $30 per month. I previously sent information on this medication. Is this something she would be willing to try? LDL was 159 in May and goal is < 70 given her history of diabetes and intolerance of statins.

## 2024-05-20 ENCOUNTER — Ambulatory Visit: Payer: Self-pay | Admitting: Physician Assistant

## 2024-05-24 ENCOUNTER — Other Ambulatory Visit: Payer: Self-pay

## 2024-05-24 DIAGNOSIS — E890 Postprocedural hypothyroidism: Secondary | ICD-10-CM

## 2024-05-25 ENCOUNTER — Other Ambulatory Visit: Payer: Self-pay | Admitting: Physician Assistant

## 2024-05-25 ENCOUNTER — Telehealth: Payer: Self-pay

## 2024-05-25 DIAGNOSIS — I10 Essential (primary) hypertension: Secondary | ICD-10-CM

## 2024-05-25 MED ORDER — SPIRONOLACTONE 25 MG PO TABS: 25.0000 mg | ORAL_TABLET | Freq: Every day | ORAL | 0 refills | Status: AC

## 2024-05-25 NOTE — Telephone Encounter (Signed)
 Copied from CRM #[id number]. Topic: General - Other >> May 25, 2024  8:45 AM Jasmin G wrote: Reason for CRM: Pt called to relay a message to Ms. Kearney Koleen RAMAN, CMA, pt states that she started taking levothyroxine  (SYNTHROID ) 137 MCG tablet and for diabetes she'll go ahead and take a third metformin  pill, currently taking 2.  Please see pt returned my call this morning in regards to lab results and recommendations. Please advise if anything further is needed for patient at this time.

## 2024-05-25 NOTE — Telephone Encounter (Signed)
 Called pt and left detailed vm with lab results and recommendations for patient. Future lab orders have been placed for pt to schedule a lab only appt in 6-8 weeks. Advised pt office number to cb or reply via MyChart to PCP recommendations for additional treatment for diabetes markers.

## 2024-05-25 NOTE — Telephone Encounter (Signed)
 Noted

## 2024-05-25 NOTE — Telephone Encounter (Signed)
 Copied from CRM #[id number]. Topic: Clinical - Medication Refill >> May 25, 2024  8:51 AM Jasmin G wrote: Medication: spironolactone  (ALDACTONE ) 25 MG tablet  Has the patient contacted their pharmacy? No (Agent: If no, request that the patient contact the pharmacy for the refill. If patient does not wish to contact the pharmacy document the reason why and proceed with request.) (Agent: If yes, when and what did the pharmacy advise?)  This is the patient's preferred pharmacy:  [id number] [place], [place] - [address] [place]  [address] Phone: [phone] Fax: [phone]  Is this the correct pharmacy for this prescription? Yes If no, delete pharmacy and type the correct one.   Has the prescription been filled recently? Yes  Is the patient out of the medication? No  Has the patient been seen for an appointment in the last year OR does the patient have an upcoming appointment? Yes  Can we respond through MyChart? No  Agent: Please be advised that Rx refills may take up to 3 business days. We ask that you follow-up with your pharmacy.

## 2024-06-11 ENCOUNTER — Ambulatory Visit

## 2024-06-22 ENCOUNTER — Encounter (HOSPITAL_BASED_OUTPATIENT_CLINIC_OR_DEPARTMENT_OTHER): Payer: Self-pay

## 2024-06-22 ENCOUNTER — Ambulatory Visit (HOSPITAL_BASED_OUTPATIENT_CLINIC_OR_DEPARTMENT_OTHER)
Admission: RE | Admit: 2024-06-22 | Discharge: 2024-06-22 | Disposition: A | Discharge status: Home / Self Care | Source: Ambulatory Visit | Attending: Obstetrics and Gynecology | Admitting: Obstetrics and Gynecology

## 2024-06-22 DIAGNOSIS — Z1231 Encounter for screening mammogram for malignant neoplasm of breast: Secondary | ICD-10-CM | POA: Insufficient documentation

## 2024-07-23 ENCOUNTER — Other Ambulatory Visit: Payer: Self-pay | Admitting: Physician Assistant

## 2024-07-23 ENCOUNTER — Ambulatory Visit: Admitting: Physician Assistant

## 2024-07-23 ENCOUNTER — Encounter: Payer: Self-pay | Admitting: Physician Assistant

## 2024-07-23 VITALS — BP 130/80 | HR 85 | Temp 97.5°F | Ht 63.0 in | Wt 197.0 lb

## 2024-07-23 DIAGNOSIS — J4520 Mild intermittent asthma, uncomplicated: Secondary | ICD-10-CM

## 2024-07-23 DIAGNOSIS — E1165 Type 2 diabetes mellitus with hyperglycemia: Secondary | ICD-10-CM

## 2024-07-23 DIAGNOSIS — R051 Acute cough: Secondary | ICD-10-CM | POA: Diagnosis not present

## 2024-07-23 DIAGNOSIS — I10 Essential (primary) hypertension: Secondary | ICD-10-CM

## 2024-07-23 MED ORDER — ALBUTEROL SULFATE HFA 108 (90 BASE) MCG/ACT IN AERS: 2.0000 | INHALATION_SPRAY | Freq: Four times a day (QID) | RESPIRATORY_TRACT | 0 refills | Status: AC | PRN

## 2024-07-23 MED ORDER — PROMETHAZINE-DM 6.25-15 MG/5ML PO SYRP: 5.0000 mL | ORAL_SOLUTION | Freq: Three times a day (TID) | ORAL | 0 refills | Status: DC | PRN

## 2024-07-23 MED ORDER — CEFUROXIME AXETIL 500 MG PO TABS: 500.0000 mg | ORAL_TABLET | Freq: Two times a day (BID) | ORAL | 0 refills | Status: AC

## 2024-07-23 MED ORDER — PREDNISONE 10 MG PO TABS: 10.0000 mg | ORAL_TABLET | Freq: Every day | ORAL | 0 refills | Status: AC

## 2024-07-23 NOTE — Progress Notes (Signed)
 "  History of Present Illness:   Chief Complaint  Patient presents with   Cough    Pt c/o cough x 2 weeks, expectorating yellow in the morning, then clear to white. Denies fever or chills. Has been taking Robitussin with the honey, Mucinex.    Discussed the use of AI scribe software for clinical note transcription with the patient, who gave verbal consent to proceed.  History of Present Illness   Paula Huffman is a 54 year old female with reactive airway disease who presents with a persistent cough.  She has had a constant cough for two weeks, which started with flu-like symptoms and rhinorrhea. She has had no fever, chills, myalgias, chest pain, dyspnea, or chest tightness. The cough does not wake her from sleep but can occur if she wakes at night.  She feels post-nasal drip contributing to the cough, with throat drainage but no facial pressure or significant nasal congestion. She was exposed to her aunt with RSV, but her own symptoms started about ten days after that exposure.  She has reactive airway disease diagnosed in urgent care a year or two ago and notes that cough often occurs with colds, flu, or allergies.  She is not currently using an albuterol  inhaler, though she has used it before for chest tightness. She previously used a daily steroid inhaler (Advair) after an illness and has used promethazine  cough syrup in the past.  She has diabetes with a recent A1c of 7.7 and is cautious about using prednisone  because of its effect on her blood glucose levels.        Past Medical History:  Diagnosis Date   Anal fissure    Anemia    GERD (gastroesophageal reflux disease)    History of reactive airway disease    Hyperlipidemia    Hypertension    Thyroid  disease    HYPOTHYROID     Social History[1]  Past Surgical History:  Procedure Laterality Date   APPENDECTOMY     AGE 40   BREAST SURGERY     LEFT BREAST EXCISION;CALCIFICATION REMOVED   CESAREAN SECTION      WISDOM TOOTH EXTRACTION      Family History  Problem Relation Age of Onset   Thyroid  disease Mother    Hypertension Mother    Diabetes Mother    Heart attack Father 45   Heart disease Father    Hypertension Father    Thyroid  disease Maternal Aunt    Heart failure Maternal Aunt    Hypertension Paternal Aunt    Heart attack Paternal Uncle    Thyroid  disease Maternal Grandmother    Hypertension Maternal Grandmother    Stroke Maternal Grandmother    Heart failure Maternal Grandmother    Hypertension Maternal Grandfather    Coronary artery disease Paternal Grandmother    Hypertension Paternal Grandfather    Heart attack Paternal Grandfather    Asthma Daughter    Heart attack Other     Allergies[2]  Current Medications:  Current Medications[3]   Review of Systems:   Negative unless otherwise specified per HPI.  Vitals:   Vitals:   07/23/24 1137  BP: 130/80  Pulse: 85  Temp: (!) 97.5 F (36.4 C)  TempSrc: Temporal  SpO2: 96%  Weight: 197 lb (89.4 kg)  Height: 5' 3 (1.6 m)     Body mass index is 34.9 kg/m.  Physical Exam:   Physical Exam Vitals and nursing note reviewed.  Constitutional:  General: She is not in acute distress.    Appearance: She is well-developed. She is not ill-appearing or toxic-appearing.  HENT:     Head: Normocephalic and atraumatic.     Right Ear: Ear canal and external ear normal. A middle ear effusion is present. Tympanic membrane is not erythematous, retracted or bulging.     Left Ear: Ear canal and external ear normal. A middle ear effusion is present. Tympanic membrane is not erythematous, retracted or bulging.     Nose: Nose normal.     Right Sinus: No maxillary sinus tenderness or frontal sinus tenderness.     Left Sinus: No maxillary sinus tenderness or frontal sinus tenderness.     Mouth/Throat:     Pharynx: Uvula midline. No posterior oropharyngeal erythema.  Eyes:     General: Lids are normal.     Conjunctiva/sclera:  Conjunctivae normal.  Neck:     Trachea: Trachea normal.  Cardiovascular:     Rate and Rhythm: Normal rate and regular rhythm.     Heart sounds: Normal heart sounds, S1 normal and S2 normal.  Pulmonary:     Effort: Pulmonary effort is normal.     Breath sounds: Normal breath sounds. No decreased breath sounds, wheezing, rhonchi or rales.  Lymphadenopathy:     Cervical: No cervical adenopathy.  Skin:    General: Skin is warm and dry.  Neurological:     Mental Status: She is alert.  Psychiatric:        Speech: Speech normal.        Behavior: Behavior normal. Behavior is cooperative.     Assessment and Plan:   Assessment and Plan    Acute cough Persistent cough with post-nasal drip and rhinorrhea. No fever or chest pain. Differential includes bronchitis and sinusitis. Antibiotic treatment considered for potential bacterial infection. - Restart cetirizine  for nasal drip. - Prescribed cefuroxime for potential bacterial infection. Has numerous antibiotic(s) allergies -- appears to have tolerated cephalosporins in the past. - Advised use of promethazine  cough syrup as needed. - Provided work note for visit.  Reactive airway disease Cough with respiratory illnesses post-COVID-2020. Limited prednisone  use due to diabetes. - Prescribed low-dose prednisone  as needed for cough management. - Prescribed albuterol  inhaler for emergency use.  Type 2 diabetes mellitus with hyperglycemia, without long-term current use of insulin (HCC)  Last A1c 7.7%. Concerns about steroid impact on blood glucose. Prednisone  use limited to low doses. - Monitor blood glucose levels closely if prednisone  is used.     Lucie Buttner, PA-C    [1]  Social History Tobacco Use   Smoking status: Never    Passive exposure: Never   Smokeless tobacco: Never  Vaping Use   Vaping status: Never Used  Substance Use Topics   Alcohol use: Yes    Comment: occ   Drug use: No  [2]  Allergies Allergen Reactions    Penicillins Other (See Comments) and Rash   Azithromycin Diarrhea, Nausea And Vomiting, Nausea Only and Other (See Comments)    Zithromax   Doxycycline Other (See Comments)   Hydrocodone-Acetaminophen Other (See Comments)   Nexletol  [Bempedoic Acid ] Other (See Comments)    Severe pain in muscle-aches    Penicillin G Other (See Comments)   Pravastatin  Other (See Comments)    Severe muscle aches    Vicodin [Hydrocodone-Acetaminophen]   [3]  Current Outpatient Medications:    albuterol  (VENTOLIN  HFA) 108 (90 Base) MCG/ACT inhaler, Inhale 1-2 puffs into the lungs every 6 (six) hours as  needed for wheezing or shortness of breath., Disp: 18 g, Rfl: 0   albuterol  (VENTOLIN  HFA) 108 (90 Base) MCG/ACT inhaler, Inhale 2 puffs into the lungs every 6 (six) hours as needed for wheezing or shortness of breath., Disp: 8 g, Rfl: 0   amLODipine  (NORVASC ) 5 MG tablet, TAKE 1 TABLET (5 MG TOTAL) BY MOUTH DAILY., Disp: 90 tablet, Rfl: 0   cefUROXime (CEFTIN) 500 MG tablet, Take 1 tablet (500 mg total) by mouth 2 (two) times daily with a meal for 7 days., Disp: 14 tablet, Rfl: 0   cetirizine  (ZYRTEC  ALLERGY) 10 MG tablet, Take 1 tablet (10 mg total) by mouth daily., Disp: 30 tablet, Rfl: 0   fluticasone (FLONASE) 50 MCG/ACT nasal spray, Place 1 spray into both nostrils as needed. PRN, Disp: , Rfl:    glucose blood test strip, Patient to check glucose levels one time daily, Disp: 100 each, Rfl: 2   levothyroxine  (SYNTHROID ) 137 MCG tablet, Take 1 tablet (137 mcg total) by mouth daily before breakfast., Disp: 90 tablet, Rfl: 0   metFORMIN  (GLUCOPHAGE -XR) 500 MG 24 hr tablet, TAKE 1 TABLET BY MOUTH 2 TIMES DAILY WITH A MEAL., Disp: 180 tablet, Rfl: 1   Multiple Vitamins-Minerals (MULTIVITAMIN ADULT, MINERALS, PO), , Disp: , Rfl:    norethindrone  (MICRONOR ,CAMILA ,ERRIN ) 0.35 MG tablet, Take 1 tablet (0.35 mg total) by mouth daily., Disp: 1 Package, Rfl: 11   predniSONE  (DELTASONE ) 10 MG tablet, Take 1 tablet (10  mg total) by mouth daily with breakfast., Disp: 5 tablet, Rfl: 0   spironolactone  (ALDACTONE ) 25 MG tablet, Take 1 tablet (25 mg total) by mouth daily., Disp: 90 tablet, Rfl: 0   aspirin 81 MG chewable tablet, Chew 81 mg by mouth as needed. 3 times weekly (Patient not taking: Reported on 07/23/2024), Disp: , Rfl:    promethazine -dextromethorphan (PROMETHAZINE -DM) 6.25-15 MG/5ML syrup, Take 5 mLs by mouth 3 (three) times daily as needed for cough., Disp: 200 mL, Rfl: 0  "

## 2024-08-04 ENCOUNTER — Ambulatory Visit: Payer: Self-pay

## 2024-08-04 ENCOUNTER — Encounter: Payer: Self-pay | Admitting: Family Medicine

## 2024-08-04 ENCOUNTER — Ambulatory Visit: Admitting: Family Medicine

## 2024-08-04 VITALS — BP 130/82 | HR 85 | Temp 98.5°F | Ht 63.0 in | Wt 202.0 lb

## 2024-08-04 DIAGNOSIS — R052 Subacute cough: Secondary | ICD-10-CM

## 2024-08-04 DIAGNOSIS — R0982 Postnasal drip: Secondary | ICD-10-CM

## 2024-08-04 MED ORDER — PROMETHAZINE-DM 6.25-15 MG/5ML PO SYRP: 5.0000 mL | ORAL_SOLUTION | Freq: Three times a day (TID) | ORAL | 0 refills | Status: AC | PRN

## 2024-08-04 NOTE — Telephone Encounter (Signed)
 FYI Only or Action Required?: FYI only for provider: appointment scheduled on 2/4.  Patient was last seen in primary care on 07/23/2024 by Job Lukes, PA.  Called Nurse Triage reporting No chief complaint on file..  Symptoms began several weeks ago.  Interventions attempted: Rest, hydration, or home remedies.  Symptoms are: unchanged.  Triage Disposition: No disposition on file.  Patient/caregiver understands and will follow disposition?:   Summary: productive cough w/clear-yellow mucous & wheezing   Reason for Triage:persistent cough that has not worsening but hasn't improved after  finishing medication prescribed, nasal congestion  productive cough with clear to yellow mucous; requesting an appt         Reason for Disposition  Cough has been present for > 3 weeks  Answer Assessment - Initial Assessment Questions 1. ONSET: When did the cough begin?      4 wks ago 2. SEVERITY: How bad is the cough today?      Mild to moderate 3. SPUTUM: Describe the color of your sputum (e.g., none, dry cough; clear, white, yellow, green)     White/ sometimes yellow 4. HEMOPTYSIS: Are you coughing up any blood? If Yes, ask: How much? (e.g., flecks, streaks, tablespoons, etc.)     no 5. DIFFICULTY BREATHING: Are you having difficulty breathing? If Yes, ask: How bad is it? (e.g., mild, moderate, severe)  NO     FEVER: Do you have a fever? If Yes, ask: What is your temperature, how was it measured, and when did it start?     NO  Protocols used: Cough - Acute Productive-A-AH

## 2024-08-04 NOTE — Telephone Encounter (Signed)
 Appt today

## 2024-08-04 NOTE — Progress Notes (Signed)
 "  Established Patient Office Visit   Subjective  Patient ID: Paula Huffman, female    DOB: May 27, 1971  Age: 54 y.o. MRN: [id number]  Chief Complaint  Patient presents with   Acute Visit    Cough and nasal congestion started 4 weeks ago, seen pcp 2 weeks ago, yellow mucus    Pt is a 54 yo female seen for ongoing concern.  Pt seen by PCP for symptoms of cough, rhinorrhea that started 2 wks ago.  Prior to symptoms starting patient was around her aunt who had RSV.  Reported continued cough given low dose pred taper and cefuroxime .  Using albuterol  inhaler prn, has history of reactive airway disease.  Cough has improved some in the last 2 days.   Developed nasal congestion yesterday.  Denies headache, fever, ear pain/pressure, facial pain/pressure, sore throat.  In addition to prescribed medications pt tried OTC 4-hour allergy medicine, Zyrtec , and Mucinex.    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   Type 2 diabetes mellitus with hyperglycemia, without long-term current use of insulin (HCC) 03/12/2023   Hypertensive disorder 05/15/2022   Hyperlipidemia 05/15/2022   Hypothyroid 05/15/2022   GERD (gastroesophageal reflux disease) 05/15/2022   Disorder of thyroid  gland 05/15/2022   Breasts asymmetrical 11/30/2021   Type 2 diabetes mellitus (HCC) 01/11/2021   Past Medical History:  Diagnosis Date   Anal fissure    Anemia    GERD (gastroesophageal reflux disease)    History of reactive airway disease    Hyperlipidemia    Hypertension    Thyroid  disease    HYPOTHYROID   Past Surgical History:  Procedure Laterality Date   APPENDECTOMY     AGE 65   BREAST SURGERY     LEFT BREAST EXCISION;CALCIFICATION REMOVED   CESAREAN SECTION     WISDOM TOOTH EXTRACTION      Social History[1] Family History  Problem Relation Age of Onset   Thyroid  disease Mother    Hypertension Mother    Diabetes Mother    Heart attack Father 35   Heart disease Father    Hypertension Father    Thyroid  disease Maternal Aunt    Heart failure Maternal Aunt    Hypertension Paternal Aunt    Heart attack Paternal Uncle    Thyroid  disease Maternal Grandmother    Hypertension Maternal Grandmother    Stroke Maternal Grandmother    Heart failure Maternal Grandmother    Hypertension Maternal Grandfather    Coronary artery disease Paternal Grandmother    Hypertension Paternal Grandfather    Heart attack Paternal Grandfather    Asthma Daughter    Heart attack Other    Allergies[2]  ROS Negative unless stated above    Objective:     BP 130/82 (BP Location: Left Arm, Patient Position: Sitting, Cuff Size: Large)   Pulse 85   Temp 98.5 F (36.9 C) (Oral)   Ht 5' 3 (1.6 m)   Wt 202 lb (91.6 kg)   SpO2 98%   BMI 35.78 kg/m  BP Readings from Last 3 Encounters:  08/04/24 130/82  07/23/24 130/80  05/18/24 130/82   Wt Readings from Last 3 Encounters:  08/04/24 202 lb (91.6 kg)  07/23/24 197 lb (89.4 kg)  05/18/24 199 lb 3.2 oz (90.4 kg)  Physical Exam Constitutional:      General: She is not in acute distress.    Appearance: Normal appearance.  HENT:     Head: Normocephalic and atraumatic.     Nose: Nose normal.     Mouth/Throat:     Mouth: Mucous membranes are moist.  Cardiovascular:     Rate and Rhythm: Normal rate and regular rhythm.     Heart sounds: Normal heart sounds. No murmur heard.    No gallop.  Pulmonary:     Effort: Pulmonary effort is normal. No respiratory distress.     Breath sounds: No wheezing, rhonchi or rales.     Comments: Dry cough. Skin:    General: Skin is warm and dry.  Neurological:     Mental Status: She is alert and oriented to person, place, and time.        05/18/2024    8:44 AM 11/14/2023    1:26 PM  07/17/2023    1:46 PM  Depression screen PHQ 2/9  Decreased Interest 0 0 0  Down, Depressed, Hopeless 0 0 0  PHQ - 2 Score 0 0 0  Altered sleeping   1  Tired, decreased energy   1  Change in appetite   0  Feeling bad or failure about yourself    0  Trouble concentrating   0  Moving slowly or fidgety/restless   0  Suicidal thoughts   0  PHQ-9 Score   2   Difficult doing work/chores   Not difficult at all     Data saved with a previous flowsheet row definition      07/17/2023    1:47 PM  GAD 7 : Generalized Anxiety Score  Nervous, Anxious, on Edge 1   Control/stop worrying 0   Worry too much - different things 1   Trouble relaxing 0   Restless 0   Easily annoyed or irritable 0   Afraid - awful might happen 0   Total GAD 7 Score 2  Anxiety Difficulty Not difficult at all     Data saved with a previous flowsheet row definition     No results found for any visits on 08/04/24.    Assessment & Plan:   Subacute cough -     Promethazine -DM; Take 5 mLs by mouth 3 (three) times daily as needed for cough.  Dispense: 200 mL; Refill: 0  Post-nasal drainage  Symptoms likely initially 2/2 viral etiology with post viral cough.  Cough improved for the last few days.  Increased nasal congestion/postnasal drainage possibly due to allergies versus new infection.  Discussed supportive care including cough medication.  Offered Tessalon.  Patient declines.  Requesting refill on promethazine .  Continue warm fluids, rest, antihistamine, saline nasal rinse, steam shower, etc. CXR not indicated at this time based on exam as lungs clear.  Follow-up with PCP for worsening symptoms.    Return if symptoms worsen or fail to improve.   Clotilda JONELLE Single, MD    [1]  Social History Tobacco Use   Smoking status: Never    Passive exposure: Never   Smokeless tobacco: Never  Vaping Use   Vaping status: Never Used  Substance Use Topics   Alcohol use: Yes    Comment: occ   Drug use: No  [2]   Allergies Allergen Reactions   Penicillins Other (See Comments) and Rash   Azithromycin Diarrhea, Nausea And Vomiting, Nausea Only and Other (See Comments)    Zithromax   Doxycycline Other (See  Comments)   Hydrocodone-Acetaminophen Other (See Comments)   Nexletol  [Bempedoic Acid ] Other (See Comments)    Severe pain in muscle-aches    Penicillin G Other (See Comments)   Pravastatin  Other (See Comments)    Severe muscle aches    Vicodin [Hydrocodone-Acetaminophen]    "

## 2024-09-16 ENCOUNTER — Ambulatory Visit: Admitting: Physician Assistant
